# Patient Record
Sex: Female | Born: 1985 | Race: White | Hispanic: No | Smoking: Former smoker
Health system: Southern US, Community
[De-identification: ages and names within clinical notes are randomized; demographics above are authoritative.]

## PROBLEM LIST (undated history)

## (undated) ENCOUNTER — Inpatient Hospital Stay (HOSPITAL_COMMUNITY): Payer: Self-pay

## (undated) DIAGNOSIS — F32A Depression, unspecified: Secondary | ICD-10-CM

## (undated) DIAGNOSIS — N200 Calculus of kidney: Secondary | ICD-10-CM

## (undated) DIAGNOSIS — R569 Unspecified convulsions: Secondary | ICD-10-CM

## (undated) DIAGNOSIS — F329 Major depressive disorder, single episode, unspecified: Secondary | ICD-10-CM

## (undated) DIAGNOSIS — N9489 Other specified conditions associated with female genital organs and menstrual cycle: Secondary | ICD-10-CM

## (undated) DIAGNOSIS — B999 Unspecified infectious disease: Secondary | ICD-10-CM

## (undated) DIAGNOSIS — I10 Essential (primary) hypertension: Secondary | ICD-10-CM

## (undated) DIAGNOSIS — G43019 Migraine without aura, intractable, without status migrainosus: Secondary | ICD-10-CM

## (undated) DIAGNOSIS — G43909 Migraine, unspecified, not intractable, without status migrainosus: Secondary | ICD-10-CM

## (undated) DIAGNOSIS — R55 Syncope and collapse: Secondary | ICD-10-CM

## (undated) HISTORY — DX: Essential (primary) hypertension: I10

## (undated) HISTORY — DX: Migraine without aura, intractable, without status migrainosus: G43.019

## (undated) HISTORY — PX: WISDOM TOOTH EXTRACTION: SHX21

## (undated) HISTORY — DX: Syncope and collapse: R55

## (undated) HISTORY — DX: Migraine, unspecified, not intractable, without status migrainosus: G43.909

## (undated) HISTORY — PX: HAND SURGERY: SHX662

## (undated) HISTORY — DX: Calculus of kidney: N20.0

---
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
## 2005-02-04 ENCOUNTER — Emergency Department (HOSPITAL_COMMUNITY): Admission: EM | Admit: 2005-02-04 | Discharge: 2005-02-04 | Payer: Self-pay | Admitting: Emergency Medicine
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
## 2006-06-24 ENCOUNTER — Ambulatory Visit: Payer: Self-pay | Admitting: Psychiatry

## 2006-06-24 ENCOUNTER — Inpatient Hospital Stay (HOSPITAL_COMMUNITY): Admission: AD | Admit: 2006-06-24 | Discharge: 2006-06-27 | Payer: Self-pay | Admitting: Psychiatry

## 2006-06-24 ENCOUNTER — Emergency Department (HOSPITAL_COMMUNITY): Admission: EM | Admit: 2006-06-24 | Discharge: 2006-06-24 | Payer: Self-pay | Admitting: *Deleted

## 2010-03-30 ENCOUNTER — Institutional Professional Consult (permissible substitution) (INDEPENDENT_AMBULATORY_CARE_PROVIDER_SITE_OTHER): Payer: BC Managed Care – PPO | Admitting: Cardiovascular Disease

## 2010-03-30 DIAGNOSIS — R079 Chest pain, unspecified: Secondary | ICD-10-CM

## 2010-04-11 ENCOUNTER — Other Ambulatory Visit (HOSPITAL_COMMUNITY): Payer: BC Managed Care – PPO

## 2010-04-12 ENCOUNTER — Telehealth (INDEPENDENT_AMBULATORY_CARE_PROVIDER_SITE_OTHER): Payer: Self-pay | Admitting: *Deleted

## 2010-04-13 ENCOUNTER — Ambulatory Visit (HOSPITAL_COMMUNITY): Payer: BC Managed Care – PPO | Attending: Internal Medicine

## 2010-04-13 ENCOUNTER — Encounter: Payer: Self-pay | Admitting: Cardiology

## 2010-04-13 DIAGNOSIS — R011 Cardiac murmur, unspecified: Secondary | ICD-10-CM | POA: Insufficient documentation

## 2010-04-13 DIAGNOSIS — R072 Precordial pain: Secondary | ICD-10-CM | POA: Insufficient documentation

## 2010-04-13 DIAGNOSIS — I059 Rheumatic mitral valve disease, unspecified: Secondary | ICD-10-CM | POA: Insufficient documentation

## 2010-04-13 DIAGNOSIS — R0989 Other specified symptoms and signs involving the circulatory and respiratory systems: Secondary | ICD-10-CM | POA: Insufficient documentation

## 2010-04-13 DIAGNOSIS — R5381 Other malaise: Secondary | ICD-10-CM | POA: Insufficient documentation

## 2010-04-13 DIAGNOSIS — R0609 Other forms of dyspnea: Secondary | ICD-10-CM | POA: Insufficient documentation

## 2010-04-13 DIAGNOSIS — R5383 Other fatigue: Secondary | ICD-10-CM | POA: Insufficient documentation

## 2010-04-13 DIAGNOSIS — I079 Rheumatic tricuspid valve disease, unspecified: Secondary | ICD-10-CM | POA: Insufficient documentation

## 2010-04-20 NOTE — Progress Notes (Signed)
Summary: nuc pre procedure  Phone Note Outgoing Call Call back at [facility] Phone [phone]   Call placed by: Cathlyn Parsons RN,  April 12, 2010 11:27 AM Call placed to: Patient Summary of Call: Left message with information on [employer] Information Sheet (see scanned document for details).

## 2010-05-11 ENCOUNTER — Ambulatory Visit (INDEPENDENT_AMBULATORY_CARE_PROVIDER_SITE_OTHER): Payer: BC Managed Care – PPO | Admitting: Cardiovascular Disease

## 2010-05-11 DIAGNOSIS — R079 Chest pain, unspecified: Secondary | ICD-10-CM

## 2011-06-07 ENCOUNTER — Encounter: Payer: Self-pay | Admitting: *Deleted

## 2011-06-07 DIAGNOSIS — N200 Calculus of kidney: Secondary | ICD-10-CM | POA: Insufficient documentation

## 2013-02-27 NOTE — L&D Delivery Note (Signed)
Delivery Note  SVD viable female Apgars 8,9 over 2nd degree ML lac.  Placenta delivered spontaneously intact with 3VC. Repair with 2-0 Chromic with good support and hemostasis noted and R/V exam confirms.  PH art was sent.  [place] cord blood was done.  Mother and baby were doing well.  EBL 300cc  Candice Camp, MD

## 2013-04-09 LAB — OB RESULTS CONSOLE RPR: RPR: NONREACTIVE

## 2013-04-09 LAB — OB RESULTS CONSOLE HEPATITIS B SURFACE ANTIGEN: Hepatitis B Surface Ag: NEGATIVE

## 2013-04-09 LAB — OB RESULTS CONSOLE RUBELLA ANTIBODY, IGM: Rubella: IMMUNE

## 2013-04-09 LAB — OB RESULTS CONSOLE ANTIBODY SCREEN: Antibody Screen: NEGATIVE

## 2013-04-09 LAB — OB RESULTS CONSOLE ABO/RH: RH Type: POSITIVE

## 2013-04-09 LAB — OB RESULTS CONSOLE HIV ANTIBODY (ROUTINE TESTING): HIV: NONREACTIVE

## 2013-04-09 LAB — OB RESULTS CONSOLE GC/CHLAMYDIA
CHLAMYDIA, DNA PROBE: NEGATIVE
Gonorrhea: NEGATIVE

## 2013-05-14 ENCOUNTER — Encounter: Payer: Self-pay | Admitting: Cardiovascular Disease

## 2013-10-10 ENCOUNTER — Encounter (HOSPITAL_COMMUNITY): Payer: Self-pay | Admitting: *Deleted

## 2013-10-10 ENCOUNTER — Inpatient Hospital Stay (HOSPITAL_COMMUNITY)
Admission: AD | Admit: 2013-10-10 | Discharge: 2013-10-10 | Disposition: A | Discharge status: Home / Self Care | Payer: BC Managed Care – PPO | Source: Ambulatory Visit | Attending: Obstetrics and Gynecology | Admitting: Obstetrics and Gynecology

## 2013-10-10 DIAGNOSIS — Z823 Family history of stroke: Secondary | ICD-10-CM | POA: Diagnosis not present

## 2013-10-10 DIAGNOSIS — Z8249 Family history of ischemic heart disease and other diseases of the circulatory system: Secondary | ICD-10-CM | POA: Diagnosis not present

## 2013-10-10 DIAGNOSIS — O10019 Pre-existing essential hypertension complicating pregnancy, unspecified trimester: Secondary | ICD-10-CM | POA: Insufficient documentation

## 2013-10-10 DIAGNOSIS — O163 Unspecified maternal hypertension, third trimester: Secondary | ICD-10-CM

## 2013-10-10 DIAGNOSIS — R030 Elevated blood-pressure reading, without diagnosis of hypertension: Secondary | ICD-10-CM | POA: Diagnosis present

## 2013-10-10 DIAGNOSIS — Z833 Family history of diabetes mellitus: Secondary | ICD-10-CM | POA: Diagnosis not present

## 2013-10-10 DIAGNOSIS — Z87891 Personal history of nicotine dependence: Secondary | ICD-10-CM | POA: Insufficient documentation

## 2013-10-10 HISTORY — DX: Unspecified infectious disease: B99.9

## 2013-10-10 HISTORY — DX: Unspecified convulsions: R56.9

## 2013-10-10 HISTORY — DX: Major depressive disorder, single episode, unspecified: F32.9

## 2013-10-10 HISTORY — DX: Depression, unspecified: F32.A

## 2013-10-10 LAB — CBC
HCT: 34.1 % — ABNORMAL LOW (ref 36.0–46.0)
Hemoglobin: 11.4 g/dL — ABNORMAL LOW (ref 12.0–15.0)
MCH: 30.6 pg (ref 26.0–34.0)
MCHC: 33.4 g/dL (ref 30.0–36.0)
MCV: 91.4 fL (ref 78.0–100.0)
Platelets: 138 10*3/uL — ABNORMAL LOW (ref 150–400)
RBC: 3.73 MIL/uL — ABNORMAL LOW (ref 3.87–5.11)
RDW: 13 % (ref 11.5–15.5)
WBC: 8.6 10*3/uL (ref 4.0–10.5)

## 2013-10-10 LAB — URINALYSIS, ROUTINE W REFLEX MICROSCOPIC
Bilirubin Urine: NEGATIVE
Glucose, UA: NEGATIVE mg/dL
Hgb urine dipstick: NEGATIVE
Ketones, ur: NEGATIVE mg/dL
Nitrite: NEGATIVE
Protein, ur: NEGATIVE mg/dL
Specific Gravity, Urine: <1.005 — ABNORMAL LOW (ref 1.005–1.030)
Urobilinogen, UA: 0.2 mg/dL (ref 0.0–1.0)
pH: 7 (ref 5.0–8.0)

## 2013-10-10 LAB — COMPREHENSIVE METABOLIC PANEL
ALT: 13 U/L (ref 0–35)
AST: 14 U/L (ref 0–37)
Albumin: 2.8 g/dL — ABNORMAL LOW (ref 3.5–5.2)
Alkaline Phosphatase: 106 U/L (ref 39–117)
Anion gap: 11 (ref 5–15)
BUN: 14 mg/dL (ref 6–23)
CO2: 21 mEq/L (ref 19–32)
Calcium: 9.5 mg/dL (ref 8.4–10.5)
Chloride: 104 mEq/L (ref 96–112)
Creatinine, Ser: 0.78 mg/dL (ref 0.50–1.10)
GFR calc Af Amer: >90 mL/min (ref >90)
GFR calc non Af Amer: >90 mL/min (ref >90)
Glucose, Bld: 80 mg/dL (ref 70–99)
Potassium: 4 mEq/L (ref 3.7–5.3)
Sodium: 136 mEq/L — ABNORMAL LOW (ref 137–147)
Total Bilirubin: <0.2 mg/dL — ABNORMAL LOW (ref 0.3–1.2)
Total Protein: 5.9 g/dL — ABNORMAL LOW (ref 6.0–8.3)

## 2013-10-10 LAB — URINE MICROSCOPIC-ADD ON

## 2013-10-10 LAB — URIC ACID: Uric Acid, Serum: 8.3 mg/dL — ABNORMAL HIGH (ref 2.4–7.0)

## 2013-10-10 NOTE — Progress Notes (Signed)
28 yo G3P0 at 35 3/7 wks seen in office today with BP of about 146/98 and negative protein in urine. Had HA behind left eye last week. HA behind right eye last night and today-mild. No vision change and no epigastric pain.  BPs here 140s / 80s and some 90s. Lungs CTA Cor RRR Abd gravid without epigastric tenderness DTR 2+ without clonus  Results for orders placed during the hospital encounter of 10/10/13 (from the past 24 hour(s))  URINALYSIS, ROUTINE W REFLEX MICROSCOPIC     Status: Abnormal   Collection Time    10/10/13 11:10 AM      Result Value Ref Range   Color, Urine YELLOW  YELLOW   APPearance CLEAR  CLEAR   Specific Gravity, Urine <1.005 (*) 1.005 - 1.030   pH 7.0  5.0 - 8.0   Glucose, UA NEGATIVE  NEGATIVE mg/dL   Hgb urine dipstick NEGATIVE  NEGATIVE   Bilirubin Urine NEGATIVE  NEGATIVE   Ketones, ur NEGATIVE  NEGATIVE mg/dL   Protein, ur NEGATIVE  NEGATIVE mg/dL   Urobilinogen, UA 0.2  0.0 - 1.0 mg/dL   Nitrite NEGATIVE  NEGATIVE   Leukocytes, UA TRACE (*) NEGATIVE  URINE MICROSCOPIC-ADD ON     Status: Abnormal   Collection Time    10/10/13 11:10 AM      Result Value Ref Range   Squamous Epithelial / LPF RARE  RARE   WBC, UA 3-6  <3 WBC/hpf   Bacteria, UA FEW (*) RARE  CBC     Status: Abnormal   Collection Time    10/10/13 11:44 AM      Result Value Ref Range   WBC 8.6  4.0 - 10.5 K/uL   RBC 3.73 (*) 3.87 - 5.11 MIL/uL   Hemoglobin 11.4 (*) 12.0 - 15.0 g/dL   HCT 16.1 (*) 09.6 - 04.5 %   MCV 91.4  78.0 - 100.0 fL   MCH 30.6  26.0 - 34.0 pg   MCHC 33.4  30.0 - 36.0 g/dL   RDW 40.9  81.1 - 91.4 %   Platelets 138 (*) 150 - 400 K/uL  COMPREHENSIVE METABOLIC PANEL     Status: Abnormal   Collection Time    10/10/13 11:44 AM      Result Value Ref Range   Sodium 136 (*) 137 - 147 mEq/L   Potassium 4.0  3.7 - 5.3 mEq/L   Chloride 104  96 - 112 mEq/L   CO2 21  19 - 32 mEq/L   Glucose, Bld 80  70 - 99 mg/dL   BUN 14  6 - 23 mg/dL   Creatinine, Ser 7.82  0.50 -  1.10 mg/dL   Calcium 9.5  8.4 - 95.6 mg/dL   Total Protein 5.9 (*) 6.0 - 8.3 g/dL   Albumin 2.8 (*) 3.5 - 5.2 g/dL   AST 14  0 - 37 U/L   ALT 13  0 - 35 U/L   Alkaline Phosphatase 106  39 - 117 U/L   Total Bilirubin <0.2 (*) 0.3 - 1.2 mg/dL   GFR calc non Af Amer >90  >90 mL/min   GFR calc Af Amer >90  >90 mL/min   Anion gap 11  5 - 15  URIC ACID     Status: Abnormal   Collection Time    10/10/13 11:44 AM      Result Value Ref Range   Uric Acid, Serum 8.3 (*) 2.4 - 7.0 mg/dL   A: Hypertension  in pregnancy      No evidence of preeclampsia  P: D/C home     Careful instructions given for MBR and reviewed symptoms of preeclampsia     FU office Monday in 3 days

## 2013-10-10 NOTE — Discharge Instructions (Signed)

## 2013-10-10 NOTE — MAU Note (Signed)
Sent from office,  Recent problem with elevated BP.  Has a throbbing headache,for further eval

## 2013-10-10 NOTE — MAU Provider Note (Signed)
History     CSN: [id number]  Arrival date and time: 10/10/13 1054   First Provider Initiated Contact with Patient 10/10/13 1257      Chief Complaint  Patient presents with   Hypertension   Headache   Hypertension Associated symptoms include headaches and shortness of breath. Pertinent negatives include no blurred vision, chest pain or palpitations.  Headache  Associated symptoms include back pain, dizziness, nausea and vomiting. Pertinent negatives include no blurred vision, coughing, fever, photophobia, sore throat or weakness. Her past medical history is significant for hypertension.   Sabrina Shamsicole Stradford is a 28 y.o. G3P0020 presenting to MAU with complaint of elevated blood pressure and headache.  Blood pressure was first noted to be elevated last week.  She has had headaches throughout the pregnancy.  Headache is moderate at this time.  She was seen in the office earlier today and advised to report to MAU.  She denies LOF, bleeding, contractions, dysuria.  She does not want any medication for headache at this time.   OB History   Grav Para Term Preterm Abortions TAB SAB Ect Mult Living   3    2  2          Past Medical History  Diagnosis Date   Kidney stones    Seizures     related to heat or not eating   Infection     UTI   Depression     not on meds, doing well    Past Surgical History  Procedure Laterality Date   Hand surgery      left; cyst removed    Family History  Problem Relation Age of Onset   Heart disease Maternal Uncle    Stroke Maternal Grandmother     great grandma   Hypertension Maternal Grandmother    Heart disease Maternal Grandfather    Diabetes Paternal Grandmother    Hearing loss Neg Hx     History  Substance Use Topics   Smoking status: Former Smoker   Smokeless tobacco: Never Used     Comment: quit 2013   Alcohol Use: Yes     Comment: none with preg    Allergies:  Allergies  Allergen Reactions   Other Rash     Nitrile exam gloves    Prescriptions prior to admission  Medication Sig Dispense Refill   acetaminophen (TYLENOL) 500 MG tablet Take 1,000 mg by mouth every 6 (six) hours as needed for headache.       Prenatal Vit-Fe Fumarate-FA (PRENATAL MULTIVITAMIN) TABS tablet Take 1 tablet by mouth daily at 12 noon.        Review of Systems  Constitutional: Negative for fever and chills.  HENT: Positive for congestion. Negative for sore throat.   Eyes: Negative for blurred vision and photophobia.  Respiratory: Positive for shortness of breath and wheezing. Negative for cough.   Cardiovascular: Negative for chest pain and palpitations.  Gastrointestinal: Positive for heartburn, nausea and vomiting. Negative for diarrhea and constipation.  Genitourinary: Positive for frequency. Negative for dysuria and urgency.  Musculoskeletal: Positive for back pain.  Skin: Positive for itching.  Neurological: Positive for dizziness and headaches. Negative for weakness.   Physical Exam   Blood pressure 148/99, pulse 65.  Physical Exam  Constitutional: She is oriented to person, place, and time. She appears well-developed and well-nourished. No distress.  HENT:  Head: Normocephalic and atraumatic.  Neck: Normal range of motion.  Cardiovascular: Normal rate and regular rhythm.  Exam reveals no gallop and  no friction rub.   No murmur heard. Respiratory: Breath sounds normal. She is in respiratory distress.  GI: Soft. She exhibits no distension. There is no tenderness.  Musculoskeletal: Normal range of motion.  Neurological: She is alert and oriented to person, place, and time.  Skin: Skin is warm and dry.  Psychiatric: She has a normal mood and affect.   Results for orders placed during the hospital encounter of 10/10/13 (from the past 24 hour(s))  URINALYSIS, ROUTINE W REFLEX MICROSCOPIC     Status: Abnormal   Collection Time    10/10/13 11:10 AM      Result Value Ref Range   Color, Urine YELLOW   YELLOW   APPearance CLEAR  CLEAR   Specific Gravity, Urine <1.005 (*) 1.005 - 1.030   pH 7.0  5.0 - 8.0   Glucose, UA NEGATIVE  NEGATIVE mg/dL   Hgb urine dipstick NEGATIVE  NEGATIVE   Bilirubin Urine NEGATIVE  NEGATIVE   Ketones, ur NEGATIVE  NEGATIVE mg/dL   Protein, ur NEGATIVE  NEGATIVE mg/dL   Urobilinogen, UA 0.2  0.0 - 1.0 mg/dL   Nitrite NEGATIVE  NEGATIVE   Leukocytes, UA TRACE (*) NEGATIVE  URINE MICROSCOPIC-ADD ON     Status: Abnormal   Collection Time    10/10/13 11:10 AM      Result Value Ref Range   Squamous Epithelial / LPF RARE  RARE   WBC, UA 3-6  <3 WBC/hpf   Bacteria, UA FEW (*) RARE  CBC     Status: Abnormal   Collection Time    10/10/13 11:44 AM      Result Value Ref Range   WBC 8.6  4.0 - 10.5 K/uL   RBC 3.73 (*) 3.87 - 5.11 MIL/uL   Hemoglobin 11.4 (*) 12.0 - 15.0 g/dL   HCT 16.1 (*) 09.6 - 04.5 %   MCV 91.4  78.0 - 100.0 fL   MCH 30.6  26.0 - 34.0 pg   MCHC 33.4  30.0 - 36.0 g/dL   RDW 40.9  81.1 - 91.4 %   Platelets 138 (*) 150 - 400 K/uL  COMPREHENSIVE METABOLIC PANEL     Status: Abnormal   Collection Time    10/10/13 11:44 AM      Result Value Ref Range   Sodium 136 (*) 137 - 147 mEq/L   Potassium 4.0  3.7 - 5.3 mEq/L   Chloride 104  96 - 112 mEq/L   CO2 21  19 - 32 mEq/L   Glucose, Bld 80  70 - 99 mg/dL   BUN 14  6 - 23 mg/dL   Creatinine, Ser 7.82  0.50 - 1.10 mg/dL   Calcium 9.5  8.4 - 95.6 mg/dL   Total Protein 5.9 (*) 6.0 - 8.3 g/dL   Albumin 2.8 (*) 3.5 - 5.2 g/dL   AST 14  0 - 37 U/L   ALT 13  0 - 35 U/L   Alkaline Phosphatase 106  39 - 117 U/L   Total Bilirubin <0.2 (*) 0.3 - 1.2 mg/dL   GFR calc non Af Amer >90  >90 mL/min   GFR calc Af Amer >90  >90 mL/min   Anion gap 11  5 - 15  URIC ACID     Status: Abnormal   Collection Time    10/10/13 11:44 AM      Result Value Ref Range   Uric Acid, Serum 8.3 (*) 2.4 - 7.0 mg/dL     MAU Course  Procedures Fetal Tracing:  Baseline:130  Variability:moderate Accelerations:15x15   Decelerations:none  Toco:irritable   MDM Discussed with Dr. Henderson Cloud who came to see pt.  He has ordered discharge for pt.  Assessment and Plan  A: 35 weeks, Hypertension  Plan: Discharge to home.  Follow up with [facility] as scheduled Return to MAU for emergencies  Bertram Denver 10/10/2013, 12:57 PM

## 2013-10-13 ENCOUNTER — Observation Stay (HOSPITAL_COMMUNITY)
Admission: AD | Admit: 2013-10-13 | Discharge: 2013-10-14 | Disposition: A | Discharge status: Home / Self Care | Payer: BC Managed Care – PPO | Source: Ambulatory Visit | Attending: Obstetrics and Gynecology | Admitting: Obstetrics and Gynecology

## 2013-10-13 ENCOUNTER — Encounter (HOSPITAL_COMMUNITY): Payer: Self-pay | Admitting: *Deleted

## 2013-10-13 DIAGNOSIS — O99340 Other mental disorders complicating pregnancy, unspecified trimester: Secondary | ICD-10-CM | POA: Insufficient documentation

## 2013-10-13 DIAGNOSIS — Z8619 Personal history of other infectious and parasitic diseases: Secondary | ICD-10-CM | POA: Diagnosis not present

## 2013-10-13 DIAGNOSIS — Z87442 Personal history of urinary calculi: Secondary | ICD-10-CM | POA: Insufficient documentation

## 2013-10-13 DIAGNOSIS — F3289 Other specified depressive episodes: Secondary | ICD-10-CM | POA: Diagnosis not present

## 2013-10-13 DIAGNOSIS — F329 Major depressive disorder, single episode, unspecified: Secondary | ICD-10-CM | POA: Diagnosis not present

## 2013-10-13 DIAGNOSIS — R609 Edema, unspecified: Secondary | ICD-10-CM | POA: Diagnosis not present

## 2013-10-13 DIAGNOSIS — O139 Gestational [pregnancy-induced] hypertension without significant proteinuria, unspecified trimester: Principal | ICD-10-CM | POA: Diagnosis present

## 2013-10-13 DIAGNOSIS — O9989 Other specified diseases and conditions complicating pregnancy, childbirth and the puerperium: Secondary | ICD-10-CM | POA: Diagnosis not present

## 2013-10-13 DIAGNOSIS — Z87891 Personal history of nicotine dependence: Secondary | ICD-10-CM | POA: Insufficient documentation

## 2013-10-13 DIAGNOSIS — G40909 Epilepsy, unspecified, not intractable, without status epilepticus: Secondary | ICD-10-CM | POA: Insufficient documentation

## 2013-10-13 DIAGNOSIS — O133 Gestational [pregnancy-induced] hypertension without significant proteinuria, third trimester: Secondary | ICD-10-CM

## 2013-10-13 LAB — URINALYSIS, ROUTINE W REFLEX MICROSCOPIC
Bilirubin Urine: NEGATIVE
Glucose, UA: NEGATIVE mg/dL
Hgb urine dipstick: NEGATIVE
Ketones, ur: NEGATIVE mg/dL
Leukocytes, UA: NEGATIVE
Nitrite: NEGATIVE
Protein, ur: NEGATIVE mg/dL
Specific Gravity, Urine: <1.005 — ABNORMAL LOW (ref 1.005–1.030)
Urobilinogen, UA: 0.2 mg/dL (ref 0.0–1.0)
pH: 6.5 (ref 5.0–8.0)

## 2013-10-13 LAB — COMPREHENSIVE METABOLIC PANEL
ALT: 12 U/L (ref 0–35)
AST: 14 U/L (ref 0–37)
Albumin: 3 g/dL — ABNORMAL LOW (ref 3.5–5.2)
Alkaline Phosphatase: 116 U/L (ref 39–117)
Anion gap: 15 (ref 5–15)
BUN: 15 mg/dL (ref 6–23)
CO2: 21 mEq/L (ref 19–32)
Calcium: 9.9 mg/dL (ref 8.4–10.5)
Chloride: 100 mEq/L (ref 96–112)
Creatinine, Ser: 0.81 mg/dL (ref 0.50–1.10)
GFR calc Af Amer: >90 mL/min (ref >90)
GFR calc non Af Amer: >90 mL/min (ref >90)
Glucose, Bld: 74 mg/dL (ref 70–99)
Potassium: 4.2 mEq/L (ref 3.7–5.3)
Sodium: 136 mEq/L — ABNORMAL LOW (ref 137–147)
Total Bilirubin: <0.2 mg/dL — ABNORMAL LOW (ref 0.3–1.2)
Total Protein: 6.3 g/dL (ref 6.0–8.3)

## 2013-10-13 LAB — URIC ACID: Uric Acid, Serum: 8.6 mg/dL — ABNORMAL HIGH (ref 2.4–7.0)

## 2013-10-13 LAB — CBC
HCT: 35.4 % — ABNORMAL LOW (ref 36.0–46.0)
Hemoglobin: 12.2 g/dL (ref 12.0–15.0)
MCH: 31.5 pg (ref 26.0–34.0)
MCHC: 34.5 g/dL (ref 30.0–36.0)
MCV: 91.5 fL (ref 78.0–100.0)
Platelets: 147 10*3/uL — ABNORMAL LOW (ref 150–400)
RBC: 3.87 MIL/uL (ref 3.87–5.11)
RDW: 13.2 % (ref 11.5–15.5)
WBC: 11.4 10*3/uL — ABNORMAL HIGH (ref 4.0–10.5)

## 2013-10-13 LAB — OB RESULTS CONSOLE GBS: GBS: NEGATIVE

## 2013-10-13 MED ORDER — PRENATAL MULTIVITAMIN CH: 1.0000 | ORAL_TABLET | Freq: Every day | ORAL | Status: DC

## 2013-10-13 MED ORDER — CALCIUM CARBONATE ANTACID 500 MG PO CHEW: 2.0000 | CHEWABLE_TABLET | ORAL | Status: DC | PRN

## 2013-10-13 MED ORDER — ACETAMINOPHEN 325 MG PO TABS: 650.0000 mg | ORAL_TABLET | ORAL | Status: DC | PRN

## 2013-10-13 MED ORDER — ZOLPIDEM TARTRATE 5 MG PO TABS: 5.0000 mg | ORAL_TABLET | Freq: Every evening | ORAL | Status: DC | PRN

## 2013-10-13 MED ORDER — DOCUSATE SODIUM 100 MG PO CAPS: 100.0000 mg | ORAL_CAPSULE | Freq: Every day | ORAL | Status: DC

## 2013-10-13 NOTE — MAU Provider Note (Signed)
History     CSN: [id number]  Arrival date and time: 10/13/13 1709   First Provider Initiated Contact with Patient 10/13/13 1924      No chief complaint on file.  HPI  Ms. Sabrina Carney is a 28 y.o. female G3P0020 at 21w6dwho presents to MAU for further evaluation of evelated BP readings in the office. She was seen here on 8/14 for similar complaints. She is due to be induced next Tuesday. She denies HA, blurred vision or abdominal pain. Reports good fetal movement.    OB History   Grav Para Term Preterm Abortions TAB SAB Ect Mult Living   _0 0      Past Medical History  Diagnosis Date  . Kidney stones   . Infection     UTI  . Depression     not on meds, doing well  . Seizures     related to heat or not eating    Past Surgical History  Procedure Laterality Date  . Hand surgery      left; cyst removed  . Wisdom tooth extraction      Family History  Problem Relation Age of Onset  . Heart disease Maternal Uncle   . Stroke Maternal Grandmother     great grandma  . Hypertension Maternal Grandmother   . Heart disease Maternal Grandfather   . Diabetes Paternal Grandmother   . Hearing loss Neg Hx     History  Substance Use Topics  . Smoking status: Former Smoker    Quit date: 10/14/2011  . Smokeless tobacco: Never Used     Comment: quit 2013  . Alcohol Use: Yes     Comment: none with preg    Allergies:  Allergies  Allergen Reactions  . Hydrocodone Nausea And Vomiting  . Other Rash    Nitrile exam gloves    Prescriptions prior to admission  Medication Sig Dispense Refill  . acetaminophen (TYLENOL) 500 MG tablet Take 1,000 mg by mouth every 6 (six) hours as needed for headache.      . Prenatal Vit-Fe Fumarate-FA (PRENATAL MULTIVITAMIN) TABS tablet Take 1 tablet by mouth daily at 12 noon.       Results for orders placed during the hospital encounter of 10/13/13 (from the past 48 hour(s))  URINALYSIS, ROUTINE W REFLEX MICROSCOPIC     Status:  Abnormal   Collection Time    10/13/13  6:15 PM      Result Value Ref Range   Color, Urine STRAW (*) YELLOW   APPearance CLEAR  CLEAR   Specific Gravity, Urine <1.005 (*) 1.005 - 1.030   pH 6.5  5.0 - 8.0   Glucose, UA NEGATIVE  NEGATIVE mg/dL   Hgb urine dipstick NEGATIVE  NEGATIVE   Bilirubin Urine NEGATIVE  NEGATIVE   Ketones, ur NEGATIVE  NEGATIVE mg/dL   Protein, ur NEGATIVE  NEGATIVE mg/dL   Urobilinogen, UA 0.2  0.0 - 1.0 mg/dL   Nitrite NEGATIVE  NEGATIVE   Leukocytes, UA NEGATIVE  NEGATIVE   Comment: MICROSCOPIC NOT DONE ON URINES WITH NEGATIVE PROTEIN, BLOOD, LEUKOCYTES, NITRITE, OR GLUCOSE <1000 mg/dL.  CBC     Status: Abnormal   Collection Time    10/13/13  6:20 PM      Result Value Ref Range   WBC 11.4 (*) 4.0 - 10.5 K/uL   RBC 3.87  3.87 - 5.11 MIL/uL   Hemoglobin 12.2  12.0 - 15.0 g/dL  HCT 35.4 (*) 36.0 - 46.0 %   MCV 91.5  78.0 - 100.0 fL   MCH 31.5  26.0 - 34.0 pg   MCHC 34.5  30.0 - 36.0 g/dL   RDW 13.2  11.5 - 15.5 %   Platelets 147 (*) 150 - 400 K/uL  COMPREHENSIVE METABOLIC PANEL     Status: Abnormal   Collection Time    10/13/13  6:20 PM      Result Value Ref Range   Sodium 136 (*) 137 - 147 mEq/L   Potassium 4.2  3.7 - 5.3 mEq/L   Chloride 100  96 - 112 mEq/L   CO2 21  19 - 32 mEq/L   Glucose, Bld 74  70 - 99 mg/dL   BUN 15  6 - 23 mg/dL   Creatinine, Ser 0.81  0.50 - 1.10 mg/dL   Calcium 9.9  8.4 - 10.5 mg/dL   Total Protein 6.3  6.0 - 8.3 g/dL   Albumin 3.0 (*) 3.5 - 5.2 g/dL   AST 14  0 - 37 U/L   ALT 12  0 - 35 U/L   Alkaline Phosphatase 116  39 - 117 U/L   Total Bilirubin <0.2 (*) 0.3 - 1.2 mg/dL   GFR calc non Af Amer >90  >90 mL/min   GFR calc Af Amer >90  >90 mL/min   Comment: (NOTE)     The eGFR has been calculated using the CKD EPI equation.     This calculation has not been validated in all clinical situations.     eGFR's persistently <90 mL/min signify possible Chronic Kidney     Disease.   Anion gap 15  5 - 15  URIC ACID      Status: Abnormal   Collection Time    10/13/13  6:20 PM      Result Value Ref Range   Uric Acid, Serum 8.6 (*) 2.4 - 7.0 mg/dL    Review of Systems  Eyes: Negative for blurred vision.  Gastrointestinal: Positive for nausea. Negative for vomiting and abdominal pain.  Neurological: Negative for dizziness and headaches.   Physical Exam   Blood pressure 130/84, pulse 63, temperature 97.6 F (36.4 C), temperature source Oral, resp. rate 16, height _0  (1.778 m), weight 103.874 kg (229 lb).  Physical Exam  Constitutional: She is oriented to person, place, and time. She appears well-developed and well-nourished. No distress.  HENT:  Head: Normocephalic.  Eyes: Pupils are equal, round, and reactive to light.  Neck: Neck supple.  Respiratory: Effort normal.  GI: Soft. Normal appearance. She exhibits no distension. There is no tenderness. There is no CVA tenderness.  Musculoskeletal:       Right ankle: She exhibits swelling.       Left ankle: She exhibits swelling.  Neurological: She is alert and oriented to person, place, and time. She has normal reflexes. GCS eye subscore is 4. GCS verbal subscore is 5. GCS motor subscore is 6.  Skin: Skin is warm. She is not diaphoretic.   Fetal Tracing: Baseline: 125 bpm  Variability: 15x15 Accelerations: 15x15 Decelerations: None Toco: None   MAU Course  Procedures None  MDM CBC Uric Acid CMP UA Fetal monitoring  Discussed patient with Dr. Helane Rima; plan to admit patient to [facility] for observation   Assessment and Plan   A:  1. Pregnancy induced hypertension, third trimester    P:  Admit to [facility] for observation per Dr. Helane Rima  Repeat labs in the [facility]  Darrelyn Hillock Shaneequa Bahner, NP  10/13/2013, 7:24 PM

## 2013-10-14 LAB — COMPREHENSIVE METABOLIC PANEL
ALBUMIN: 2.6 g/dL — AB (ref 3.5–5.2)
ALK PHOS: 113 U/L (ref 39–117)
ALT: 11 U/L (ref 0–35)
AST: 13 U/L (ref 0–37)
Anion gap: 12 (ref 5–15)
BUN: 15 mg/dL (ref 6–23)
CO2: 22 mEq/L (ref 19–32)
Calcium: 8.8 mg/dL (ref 8.4–10.5)
Chloride: 105 mEq/L (ref 96–112)
Creatinine, Ser: 0.8 mg/dL (ref 0.50–1.10)
GFR calc Af Amer: >90 mL/min (ref >90)
GFR calc non Af Amer: >90 mL/min (ref >90)
Glucose, Bld: 77 mg/dL (ref 70–99)
Potassium: 4.3 mEq/L (ref 3.7–5.3)
SODIUM: 139 meq/L (ref 137–147)
Total Bilirubin: <0.2 mg/dL — ABNORMAL LOW (ref 0.3–1.2)
Total Protein: 5.8 g/dL — ABNORMAL LOW (ref 6.0–8.3)

## 2013-10-14 LAB — URINALYSIS, ROUTINE W REFLEX MICROSCOPIC
BILIRUBIN URINE: NEGATIVE
GLUCOSE, UA: NEGATIVE mg/dL
Hgb urine dipstick: NEGATIVE
Ketones, ur: NEGATIVE mg/dL
LEUKOCYTES UA: NEGATIVE
Nitrite: NEGATIVE
PH: 7 (ref 5.0–8.0)
Protein, ur: NEGATIVE mg/dL
Specific Gravity, Urine: <1.005 — ABNORMAL LOW (ref 1.005–1.030)
Urobilinogen, UA: 0.2 mg/dL (ref 0.0–1.0)

## 2013-10-14 LAB — CBC
HCT: 35.6 % — ABNORMAL LOW (ref 36.0–46.0)
HEMOGLOBIN: 12 g/dL (ref 12.0–15.0)
MCH: 30.9 pg (ref 26.0–34.0)
MCHC: 33.7 g/dL (ref 30.0–36.0)
MCV: 91.8 fL (ref 78.0–100.0)
Platelets: 136 10*3/uL — ABNORMAL LOW (ref 150–400)
RBC: 3.88 MIL/uL (ref 3.87–5.11)
RDW: 13.1 % (ref 11.5–15.5)
WBC: 8.1 10*3/uL (ref 4.0–10.5)

## 2013-10-14 LAB — URIC ACID: Uric Acid, Serum: 8.5 mg/dL — ABNORMAL HIGH (ref 2.4–7.0)

## 2013-10-14 NOTE — Discharge Instructions (Signed)
°Hypertension During Pregnancy °Hypertension is also called high blood pressure. Blood pressure moves blood in your body. Sometimes, the force that moves the blood becomes too strong. When you are pregnant, this condition should be watched carefully. It can cause problems for you and your baby. °HOME CARE  °· Make and keep all of your doctor visits. °· Take medicine as told by your doctor. Tell your doctor about all medicines you take. °· Eat very little salt. °· Exercise regularly. °· Do not drink alcohol. °· Do not smoke. °· Do not have drinks with caffeine. °· Lie on your left side when resting. °· Your health care provider may ask you to take one low-dose aspirin (81mg) each day. °GET HELP RIGHT AWAY IF: °· You have bad belly (abdominal) pain. °· You have sudden puffiness (swelling) in the hands, ankles, or face. °· You gain 4 pounds (1.8 kilograms) or more in 1 week. °· You throw up (vomit) repeatedly. °· You have bleeding from the vagina. °· You do not feel the baby moving as much. °· You have a headache. °· You have blurred or double vision. °· You have muscle twitching or spasms. °· You have shortness of breath. °· You have blue fingernails and lips. °· You have blood in your pee (urine). °MAKE SURE YOU: °· Understand these instructions. °· Will watch your condition. °· Will get help right away if you are not doing well or get worse. °Document Released: 03/18/2010 Document Revised: 06/30/2013 Document Reviewed: 09/12/2012 °ExitCare® Patient Information ©2015 [employer]. This information is not intended to replace advice given to you by your health care provider. Make sure you discuss any questions you have with your health care provider. ° ° °Preeclampsia and Eclampsia °Preeclampsia is a serious condition that develops only during pregnancy. It is also called toxemia of pregnancy. This condition causes high blood pressure along with other symptoms, such as swelling and headaches. These may develop as the  condition gets worse. Preeclampsia may occur 20 weeks or later into your pregnancy.  °Diagnosing and treating preeclampsia early is very important. If not treated early, it can cause serious problems for you and your baby. One problem it can lead to is eclampsia, which is a condition that causes muscle jerking or shaking (convulsions) in the mother. Delivering your baby is the best treatment for preeclampsia or eclampsia.  °RISK FACTORS °The cause of preeclampsia is not known. You may be more likely to develop preeclampsia if you have certain risk factors. These include:  °· Being pregnant for the first time. °· Having preeclampsia in a past pregnancy. °· Having a family history of preeclampsia. °· Having high blood pressure. °· Being pregnant with twins or triplets. °· Being 35 or older. °· Being African American. °· Having kidney disease or diabetes. °· Having medical conditions such as lupus or blood diseases. °· Being very overweight (obese). °SIGNS AND SYMPTOMS  °The earliest signs of preeclampsia are: °· High blood pressure. °· Increased protein in your urine. Your health care provider will check for this at every prenatal visit. °Other symptoms that can develop include:  °· Severe headaches. °· Sudden weight gain. °· Swelling of your hands, face, legs, and feet. °· Feeling sick to your stomach (nauseous) and throwing up (vomiting). °· Vision problems (blurred or double vision). °· Numbness in your face, arms, legs, and feet. °· Dizziness. °· Slurred speech. °· Sensitivity to bright lights. °· Abdominal pain. °DIAGNOSIS  °There are no screening tests for preeclampsia. Your health   provider will ask you about symptoms and check for signs of preeclampsia during your prenatal visits. You may also have tests, including:  Urine testing.  Blood testing.  Checking your baby's heart rate.  Checking the health of your baby and your placenta using images created with sound waves (ultrasound). TREATMENT    You can work out the best treatment approach together with your health care provider. It is very important to keep all prenatal appointments. If you have an increased risk of preeclampsia, you may need more frequent prenatal exams.  Your health care provider may prescribe bed rest.  You may have to eat as little salt as possible.  You may need to take medicine to lower your blood pressure if the condition does not respond to more conservative measures.  You may need to stay in the hospital if your condition is severe. There, treatment will focus on controlling your blood pressure and fluid retention. You may also need to take medicine to prevent seizures.  If the condition gets worse, your baby may need to be delivered early to protect you and the baby. You may have your labor started with medicine (be induced), or you may have a cesarean delivery.  Preeclampsia usually goes away after the baby is born. HOME CARE INSTRUCTIONS   Only take over-the-counter or prescription medicines as directed by your health care provider.  Lie on your left side while resting. This keeps pressure off your baby.  Elevate your feet while resting.  Get regular exercise. Ask your health care provider what type of exercise is safe for you.  Avoid caffeine and alcohol.  Do not smoke.  Drink 6-8 glasses of water every day.  Eat a balanced diet that is low in salt. Do not add salt to your food.  Avoid stressful situations as much as possible.  Get plenty of rest and sleep.  Keep all prenatal appointments and tests as scheduled. SEEK MEDICAL CARE IF:  You are gaining more weight than expected.  You have any headaches, abdominal pain, or nausea.  You are bruising more than usual.  You feel dizzy or light-headed. SEEK IMMEDIATE MEDICAL CARE IF:   You develop sudden or severe swelling anywhere in your body. This usually happens in the legs.  You gain 5 lb (2.3 kg) or more in a week.  You have a  severe headache, dizziness, problems with your vision, or confusion.  You have severe abdominal pain.  You have lasting nausea or vomiting.  You have a seizure.  You have trouble moving any part of your body.  You develop numbness in your body.  You have trouble speaking.  You have any abnormal bleeding.  You develop a stiff neck.  You pass out. MAKE SURE YOU:   Understand these instructions.  Will watch your condition.  Will get help right away if you are not doing well or get worse. Document Released: 02/11/2000 Document Revised: 02/18/2013 Document Reviewed: 12/06/2012 [facility] Patient Information 2015 [place], [place]. This information is not intended to replace advice given to you by your health care provider. Make sure you discuss any questions you have with your health care provider.  Early Elective Birth Early elective birth refers to making a choice to have a baby before the time the baby is due. The length of a pregnancy is 9 months, or 40 weeks, starting from the beginning of a woman's last menstrual period. Most women naturally go into labor around 40 weeks of gestation. A full-term pregnancy  is considered between 37 weeks and 42 weeks of gestation. Currently, early elective births can take place sometime after 39 weeks of gestation. Most health care providers practice within the guidelines of delivering a baby no later than 42 weeks of gestation and no earlier than 39 weeks of gestation. There are exceptions to this time interval, and the risks involved to the mother and baby need to be considered in those cases.  Induction of labor refers to the use of medicines to bring aboutcontractions. Labor is when the cervix starts to widen (dilate). Active labor is when there are contractions and the cervix has dilated to at least 4 cm. Oftentimes, the earlier a mother is in her pregnancy, the longer it takes to get induced. When the cervix is ready (dilated and soft), an induction  may take less than a day. However, when a cervix is far away from being ready (long, closed, and firm), it may take days in a hospital for labor to start.  Currently, 39 weeks of gestation is considered the earliest a health care providershould start the induction process. This is because the longer the baby stays inside the uterus, the lower the risks are to both the baby and mother. However, sometimes there are very good reasons for a pregnancy to be induced before 39 weeks of gestation. These exceptions are specific to each individual pregnancy and need to be considered on a case-by-case basis. A good reason to induce one pregnancy may not be a good reason for another pregnancy.  REASONS FOR ELECTIVE BIRTH It may be safer to induce labor before 39 weeks of gestation if:   A woman is carrying more than 1 baby. Current standards are to deliver twin pregnancies at 38 weeks of gestation.  A woman is having complications, such as:  High blood pressure caused by pregnancy (preeclampsia).  Bleeding.  Infection.  There are conditions affecting the baby's health, such as:  Intrauterine growth restriction (IUGR), where the baby is not growing well.  Having abnormal fetal heart rate patterns on the monitor (nonreassuring tracing).  Having a lack of fluid that surrounds the baby (oligohydramnios).  Having placental issues.  Fluid that surrounds the baby (amniotic fluid) is leaking. There are many other safety reasons that a pregnancy may need to be induced early. REASONS AGAINST ELECTIVE BIRTH Sometimes early elective birth is not the best choice. It may not be a good idea if:   An early birth is just more convenient.  You want the baby to be born on a certain date, like a holiday.  You are more likely to need a cesarean delivery before 39 weeks of gestation. A cesarean delivery can lead to other problems. Problems include infection, bleeding, and not having enough iron in your blood  (anemia), which can cause weakness.  Babies born early (34-37 weeks of gestation):  May need special care at the hospital or in a special care nursery.  Are at a greater risk for:  Brain damage.  Feeding problems.  Breathing problems.  Slow physical and mental development.  May need special care in a neonatal intensive care unit (NICU), but this is rare. The length of the baby's stay in the hospital will depend on how quickly he or she progresses to a safe level of care.  Are at a greater risk for:  Infection.  Bleeding inside the brain.  Dying during their first year of life. REDUCING EARLY ELECTIVE BIRTHS Carrying a baby longer than 42 weeks of gestation  is not good for the baby or the mother. A full-term pregnancy is best for baby and mother. Anything earlier can be risky for you and your baby. Remember:  An early elective birth may lead to a cesarean delivery. This can lead to other problems for the mother and baby.  An early elective birth can result in developmental problems for your child.  A baby's brain continues to develop while in the uterus.  A baby's body continues to develop. The baby will be better able to breathe and eat when he or she is born near the due date.  A baby who stays in the uterus longer responds better. The baby will also bond better with you. Document Released: 10/26/2010 Document Revised: 06/30/2013 Document Reviewed: 09/12/2012 [facility] Patient Information 2015 [place], [place]. This information is not intended to replace advice given to you by your health care provider. Make sure you discuss any questions you have with your health care provider.  Early Elective Birth Early elective birth refers to making a choice to have a baby before the time the baby is due. The length of a pregnancy is 9 months, or 40 weeks, starting from the beginning of a woman's last menstrual period. Most women naturally go into labor around 40 weeks of gestation. A  full-term pregnancy is considered between 37 weeks and 42 weeks of gestation. Currently, early elective births can take place sometime after 39 weeks of gestation. Most health care providers practice within the guidelines of delivering a baby no later than 42 weeks of gestation and no earlier than 39 weeks of gestation. There are exceptions to this time interval, and the risks involved to the mother and baby need to be considered in those cases.  Induction of labor refers to the use of medicines to bring aboutcontractions. Labor is when the cervix starts to widen (dilate). Active labor is when there are contractions and the cervix has dilated to at least 4 cm. Oftentimes, the earlier a mother is in her pregnancy, the longer it takes to get induced. When the cervix is ready (dilated and soft), an induction may take less than a day. However, when a cervix is far away from being ready (long, closed, and firm), it may take days in a hospital for labor to start.  Currently, 39 weeks of gestation is considered the earliest a health care providershould start the induction process. This is because the longer the baby stays inside the uterus, the lower the risks are to both the baby and mother. However, sometimes there are very good reasons for a pregnancy to be induced before 39 weeks of gestation. These exceptions are specific to each individual pregnancy and need to be considered on a case-by-case basis. A good reason to induce one pregnancy may not be a good reason for another pregnancy.  REASONS FOR ELECTIVE BIRTH It may be safer to induce labor before 39 weeks of gestation if:   A woman is carrying more than 1 baby. Current standards are to deliver twin pregnancies at 38 weeks of gestation.  A woman is having complications, such as:  High blood pressure caused by pregnancy (preeclampsia).  Bleeding.  Infection.  There are conditions affecting the baby's health, such as:  Intrauterine growth  restriction (IUGR), where the baby is not growing well.  Having abnormal fetal heart rate patterns on the monitor (nonreassuring tracing).  Having a lack of fluid that surrounds the baby (oligohydramnios).  Having placental issues.  Fluid that surrounds the baby (  amniotic fluid) is leaking. There are many other safety reasons that a pregnancy may need to be induced early. REASONS AGAINST ELECTIVE BIRTH Sometimes early elective birth is not the best choice. It may not be a good idea if:   An early birth is just more convenient.  You want the baby to be born on a certain date, like a holiday.  You are more likely to need a cesarean delivery before 39 weeks of gestation. A cesarean delivery can lead to other problems. Problems include infection, bleeding, and not having enough iron in your blood (anemia), which can cause weakness.  Babies born early (34-37 weeks of gestation):  May need special care at the hospital or in a special care nursery.  Are at a greater risk for:  Brain damage.  Feeding problems.  Breathing problems.  Slow physical and mental development.  May need special care in a neonatal intensive care unit (NICU), but this is rare. The length of the baby's stay in the hospital will depend on how quickly he or she progresses to a safe level of care.  Are at a greater risk for:  Infection.  Bleeding inside the brain.  Dying during their first year of life. REDUCING EARLY ELECTIVE BIRTHS Carrying a baby longer than 42 weeks of gestation is not good for the baby or the mother. A full-term pregnancy is best for baby and mother. Anything earlier can be risky for you and your baby. Remember:  An early elective birth may lead to a cesarean delivery. This can lead to other problems for the mother and baby.  An early elective birth can result in developmental problems for your child.  A baby's brain continues to develop while in the uterus.  A baby's body  continues to develop. The baby will be better able to breathe and eat when he or she is born near the due date.  A baby who stays in the uterus longer responds better. The baby will also bond better with you. Document Released: 10/26/2010 Document Revised: 06/30/2013 Document Reviewed: 09/12/2012 [facility] Patient Information 2015 [place], [place]. This information is not intended to replace advice given to you by your health care provider. Make sure you discuss any questions you have with your health care provider.

## 2013-10-14 NOTE — Discharge Summary (Signed)
Physician Discharge Summary  Patient ID: Sabrina Carney MRN: [id number] DOB/AGE: June 18, 1985 28 y.o.  Admit date: 10/13/2013 Discharge date: 10/14/2013  Admission Diagnoses:Gestational HTN  Discharge Diagnoses: Same Active Problems:   Pregnancy induced hypertension   Discharged Condition: good  Hospital Course: Pt admitted for observation due to elevated BPs and 36 weeks and increased edema.  Known to have Gest HTN.  Originally elevated BPs improved with rest and no further sxs of PIH.  Serial labs were normal and after 24 hours , BP was 129/66 with normal labs and sent home with f/u in 3 days  Consults: None  Significant Diagnostic Studies: labs: see chart  Treatments: BP and lab monitoring, and EFM  Discharge Exam: Blood pressure 146/100, pulse 78, temperature 97.6 F (36.4 C), temperature source Oral, resp. rate 20, height 5\' 10"  (1.778 m), weight 103.874 kg (229 lb). General appearance: alert, cooperative, appears stated age and no distress Abd Gravid nt, DTRs 1/4, no clonus, trace pedal edema  Disposition: 01-Home or Self Care  Discharge Instructions   Call MD for:  difficulty breathing, headache or visual disturbances    Complete by:  As directed      Call MD for:  persistant nausea and vomiting    Complete by:  As directed      Call MD for:  redness, tenderness, or signs of infection (pain, swelling, redness, odor or green/yellow discharge around incision site)    Complete by:  As directed      Call MD for:  severe uncontrolled pain    Complete by:  As directed      Call MD for:  temperature >100.4    Complete by:  As directed      Diet general    Complete by:  As directed      Discharge instructions    Complete by:  As directed   Modified Bedrest with pre eclampsia warnings Follow up in office in 3 days with Biophysical profile and BP check            Medication List         acetaminophen 500 MG tablet  Commonly known as:  TYLENOL  Take 1,000 mg by  mouth every 6 (six) hours as needed for headache.     prenatal multivitamin Tabs tablet  Take 1 tablet by mouth daily at 12 noon.         Signed: Ardeth Repetto C 10/14/2013, 9:11 AM

## 2013-10-14 NOTE — Progress Notes (Signed)
Pt given discharge instructions, pt states understanding with no needs or complaints at this time. Pt discharged in wheelchair with significant other.

## 2013-10-17 ENCOUNTER — Encounter (HOSPITAL_COMMUNITY): Payer: Self-pay | Admitting: *Deleted

## 2013-10-17 ENCOUNTER — Telehealth (HOSPITAL_COMMUNITY): Payer: Self-pay | Admitting: *Deleted

## 2013-10-17 NOTE — Telephone Encounter (Signed)
Preadmission screen  

## 2013-10-21 ENCOUNTER — Encounter (HOSPITAL_COMMUNITY): Payer: Self-pay

## 2013-10-21 ENCOUNTER — Inpatient Hospital Stay (HOSPITAL_COMMUNITY)
Admission: RE | Admit: 2013-10-21 | Discharge: 2013-10-26 | DRG: 775 | Disposition: A | Discharge status: Home / Self Care | Payer: BC Managed Care – PPO | Source: Ambulatory Visit | Attending: Obstetrics and Gynecology | Admitting: Obstetrics and Gynecology

## 2013-10-21 VITALS — BP 146/85 | HR 81 | Temp 97.3°F | Resp 18 | Ht 70.0 in | Wt 222.5 lb

## 2013-10-21 DIAGNOSIS — F3289 Other specified depressive episodes: Secondary | ICD-10-CM | POA: Diagnosis present

## 2013-10-21 DIAGNOSIS — N200 Calculus of kidney: Secondary | ICD-10-CM

## 2013-10-21 DIAGNOSIS — O139 Gestational [pregnancy-induced] hypertension without significant proteinuria, unspecified trimester: Secondary | ICD-10-CM | POA: Diagnosis present

## 2013-10-21 DIAGNOSIS — F329 Major depressive disorder, single episode, unspecified: Secondary | ICD-10-CM | POA: Diagnosis present

## 2013-10-21 DIAGNOSIS — O99344 Other mental disorders complicating childbirth: Secondary | ICD-10-CM | POA: Diagnosis present

## 2013-10-21 DIAGNOSIS — O133 Gestational [pregnancy-induced] hypertension without significant proteinuria, third trimester: Secondary | ICD-10-CM

## 2013-10-21 LAB — COMPREHENSIVE METABOLIC PANEL
ALT: 11 U/L (ref 0–35)
AST: 14 U/L (ref 0–37)
Albumin: 2.7 g/dL — ABNORMAL LOW (ref 3.5–5.2)
Alkaline Phosphatase: 121 U/L — ABNORMAL HIGH (ref 39–117)
Anion gap: 15 (ref 5–15)
BUN: 16 mg/dL (ref 6–23)
CO2: 20 meq/L (ref 19–32)
Calcium: 9.1 mg/dL (ref 8.4–10.5)
Chloride: 102 mEq/L (ref 96–112)
Creatinine, Ser: 0.72 mg/dL (ref 0.50–1.10)
GFR calc Af Amer: >90 mL/min (ref >90)
Glucose, Bld: 80 mg/dL (ref 70–99)
POTASSIUM: 4.1 meq/L (ref 3.7–5.3)
SODIUM: 137 meq/L (ref 137–147)
Total Protein: 6 g/dL (ref 6.0–8.3)

## 2013-10-21 LAB — CBC
HCT: 34.4 % — ABNORMAL LOW (ref 36.0–46.0)
Hemoglobin: 11.7 g/dL — ABNORMAL LOW (ref 12.0–15.0)
MCH: 31 pg (ref 26.0–34.0)
MCHC: 34 g/dL (ref 30.0–36.0)
MCV: 91 fL (ref 78.0–100.0)
PLATELETS: 144 10*3/uL — AB (ref 150–400)
RBC: 3.78 MIL/uL — AB (ref 3.87–5.11)
RDW: 13.3 % (ref 11.5–15.5)
WBC: 11.4 10*3/uL — ABNORMAL HIGH (ref 4.0–10.5)

## 2013-10-21 LAB — URIC ACID: Uric Acid, Serum: 7.8 mg/dL — ABNORMAL HIGH (ref 2.4–7.0)

## 2013-10-21 MED ORDER — ONDANSETRON HCL 4 MG/2ML IJ SOLN: 4.0000 mg | Freq: Four times a day (QID) | INTRAMUSCULAR | Status: DC | PRN

## 2013-10-21 MED ORDER — CITRIC ACID-SODIUM CITRATE 334-500 MG/5ML PO SOLN
30.0000 mL | ORAL | Status: DC | PRN
  Filled 2013-10-21: qty 15

## 2013-10-21 MED ORDER — FLEET ENEMA 7-19 GM/118ML RE ENEM: 1.0000 | ENEMA | RECTAL | Status: DC | PRN

## 2013-10-21 MED ORDER — ZOLPIDEM TARTRATE 5 MG PO TABS: 5.0000 mg | ORAL_TABLET | Freq: Every evening | ORAL | Status: DC | PRN

## 2013-10-21 MED ORDER — MISOPROSTOL 25 MCG QUARTER TABLET
25.0000 ug | ORAL_TABLET | ORAL | Status: DC | PRN
  Administered 2013-10-21 – 2013-10-22 (×3): 25 ug via VAGINAL
  Filled 2013-10-21: qty 0.25
  Filled 2013-10-21: qty 1
  Filled 2013-10-21 (×2): qty 0.25

## 2013-10-21 MED ORDER — TERBUTALINE SULFATE 1 MG/ML IJ SOLN: 0.2500 mg | Freq: Once | INTRAMUSCULAR | Status: AC | PRN

## 2013-10-21 MED ORDER — LACTATED RINGERS IV SOLN: 500.0000 mL | INTRAVENOUS | Status: DC | PRN

## 2013-10-21 MED ORDER — LACTATED RINGERS IV SOLN
INTRAVENOUS | Status: DC
  Administered 2013-10-21 – 2013-10-23 (×4): via INTRAVENOUS

## 2013-10-21 MED ORDER — BUTORPHANOL TARTRATE 1 MG/ML IJ SOLN
1.0000 mg | INTRAMUSCULAR | Status: DC | PRN
  Administered 2013-10-22 – 2013-10-23 (×2): 1 mg via INTRAVENOUS
  Filled 2013-10-21 (×2): qty 1

## 2013-10-21 MED ORDER — ACETAMINOPHEN 325 MG PO TABS: 650.0000 mg | ORAL_TABLET | ORAL | Status: DC | PRN

## 2013-10-21 MED ORDER — HYDROXYZINE HCL 50 MG PO TABS
50.0000 mg | ORAL_TABLET | Freq: Four times a day (QID) | ORAL | Status: DC | PRN
  Filled 2013-10-21: qty 1

## 2013-10-21 MED ORDER — LIDOCAINE HCL (PF) 1 % IJ SOLN
30.0000 mL | INTRAMUSCULAR | Status: DC | PRN
  Administered 2013-10-23: 30 mL via SUBCUTANEOUS
  Filled 2013-10-21: qty 30

## 2013-10-21 MED ORDER — OXYTOCIN BOLUS FROM INFUSION: 500.0000 mL | INTRAVENOUS | Status: DC

## 2013-10-21 MED ORDER — IBUPROFEN 600 MG PO TABS: 600.0000 mg | ORAL_TABLET | Freq: Four times a day (QID) | ORAL | Status: DC | PRN

## 2013-10-21 MED ORDER — OXYCODONE-ACETAMINOPHEN 5-325 MG PO TABS: 1.0000 | ORAL_TABLET | ORAL | Status: DC | PRN

## 2013-10-21 MED ORDER — OXYTOCIN 40 UNITS IN LACTATED RINGERS INFUSION - SIMPLE MED
62.5000 mL/h | INTRAVENOUS | Status: DC
  Administered 2013-10-23: 62.5 mL/h via INTRAVENOUS
  Filled 2013-10-21: qty 1000

## 2013-10-22 LAB — RPR

## 2013-10-22 MED ORDER — DIPHENHYDRAMINE HCL 50 MG/ML IJ SOLN: 12.5000 mg | INTRAMUSCULAR | Status: DC | PRN

## 2013-10-22 MED ORDER — PHENYLEPHRINE 40 MCG/ML (10ML) SYRINGE FOR IV PUSH (FOR BLOOD PRESSURE SUPPORT)
80.0000 ug | PREFILLED_SYRINGE | INTRAVENOUS | Status: DC | PRN
  Filled 2013-10-22: qty 10
  Filled 2013-10-22: qty 2

## 2013-10-22 MED ORDER — OXYTOCIN 40 UNITS IN LACTATED RINGERS INFUSION - SIMPLE MED
1.0000 m[IU]/min | INTRAVENOUS | Status: DC
Start: 2013-10-22 — End: 2013-10-23
  Administered 2013-10-22: 2 m[IU]/min via INTRAVENOUS

## 2013-10-22 MED ORDER — MAGNESIUM SULFATE 40 G IN LACTATED RINGERS - SIMPLE
2.0000 g/h | INTRAVENOUS | Status: DC
  Administered 2013-10-23: 2 g/h via INTRAVENOUS
  Filled 2013-10-22 (×2): qty 500

## 2013-10-22 MED ORDER — EPHEDRINE 5 MG/ML INJ
10.0000 mg | INTRAVENOUS | Status: DC | PRN
  Filled 2013-10-22: qty 4
  Filled 2013-10-22: qty 2

## 2013-10-22 MED ORDER — MAGNESIUM SULFATE BOLUS VIA INFUSION
4.0000 g | Freq: Once | INTRAVENOUS | Status: AC
  Administered 2013-10-22: 4 g via INTRAVENOUS
  Filled 2013-10-22: qty 500

## 2013-10-22 MED ORDER — TERBUTALINE SULFATE 1 MG/ML IJ SOLN: 0.2500 mg | Freq: Once | INTRAMUSCULAR | Status: AC | PRN

## 2013-10-22 MED ORDER — LACTATED RINGERS IV SOLN: 500.0000 mL | Freq: Once | INTRAVENOUS | Status: DC

## 2013-10-22 MED ORDER — FENTANYL 2.5 MCG/ML BUPIVACAINE 1/10 % EPIDURAL INFUSION (WH - ANES)
14.0000 mL/h | INTRAMUSCULAR | Status: DC | PRN
  Administered 2013-10-23: 14 mL/h via EPIDURAL
  Filled 2013-10-22 (×2): qty 125

## 2013-10-22 MED ORDER — PHENYLEPHRINE 40 MCG/ML (10ML) SYRINGE FOR IV PUSH (FOR BLOOD PRESSURE SUPPORT)
80.0000 ug | PREFILLED_SYRINGE | INTRAVENOUS | Status: DC | PRN
  Filled 2013-10-22: qty 2

## 2013-10-22 MED ORDER — EPHEDRINE 5 MG/ML INJ
10.0000 mg | INTRAVENOUS | Status: DC | PRN
  Filled 2013-10-22: qty 2

## 2013-10-22 NOTE — Consult Note (Signed)
Sabrina Carney, Sabrina Carney            ACCOUNT NO.:  [id number]  MEDICAL RECORD NO.:  [id number]  LOCATION:  9170                          FACILITY:  [facility]  PHYSICIAN:  Duke Salvia. Marcelle Overlie, M.D.DATE OF BIRTH:  Aug 24, 1985  DATE OF CONSULTATION: DATE OF DISCHARGE:                                CONSULTATION   CHIEF COMPLAINT:  Mild preeclampsia at term.  HPI:  A 28 year old, G1, P0, EDD November 11, 2013, with a mild preeclampsia presents for labor induction.  The patient was noted to have BP 152/100 with 1+ protein on October 17, 2013, ultrasound then [facility] 8/8, noted to be vertex with [facility] labs that were obtained and were normal.  She presents now for 2 stage labor induction. Her 1 hour GTT was normal at 86.  PAST MEDICAL HISTORY:  Blood type is A positive.  Pregnancy was conceived with donor sperm IUI at [facility].  For the remainder of her past medical history, please see the [facility] form for details which include prior history of depression, not currently on antidepressants.  PHYSICAL EXAMINATION:  VITAL SIGNS:  Temp 98.2, blood pressure 152/100. HEENT:  Unremarkable. NECK:  Supple without masses. LUNGS:  Clear. CARDIOVASCULAR:  Regular rate and rhythm without murmurs, rubs, gallop sounds. BREASTS:  Not examined, 37 cm fundal height, vertex.  Fetal heart rate 140, cervix was 1, thick, -2. EXTREMITIES:  Revealed 1+ edema.  Reflexes 1 to 2+.  No clonus.  IMPRESSION:  A 37-week intrauterine pregnancy, mild pregnancy-induced hypertension.  PLAN:  Two-stage labor induction.     Erland Vivas M. Marcelle Overlie, M.D.     RM[facility]/MEDQ  D:  10/22/2013  T:  10/22/2013  Job:  [id number]

## 2013-10-22 NOTE — H&P (Signed)
Sabrina Carney  DICTATION # [id number] CSN# [id number]   [name], MD 10/22/2013 11:08 AM

## 2013-10-22 NOTE — Progress Notes (Signed)
Has rec'd Cytotec X 3, only irreg ctx, now with cx 1-2/50/ballot vtx>>>discussed rest and restart Cytotec or continuing with pitocin, she prefers pi per protocol, FHR cat 1 /stable BP

## 2013-10-22 NOTE — H&P (Signed)
  28 yo G3P0 at 72 0/7 weeks with gestational hypertension presents for IOL  PMH/PSH/OBH see prenatal record  BP 150s/90s FHT reactive UCs irregular  A: Gestational Hypertension  P: Two Stage induction of labor      Labs

## 2013-10-22 NOTE — Progress Notes (Signed)
Patient ID: Sabrina Carney, female   DOB: May 06, 1985, 28 y.o.   MRN: [id number] [redacted]w[redacted]d   rec'd 1 dose cytotec last night, now 1/long/fltg vtx, FHR cat 1 >>will rpt cytotec X 1//

## 2013-10-23 ENCOUNTER — Encounter (HOSPITAL_COMMUNITY): Payer: BC Managed Care – PPO | Admitting: Anesthesiology

## 2013-10-23 ENCOUNTER — Inpatient Hospital Stay (HOSPITAL_COMMUNITY): Payer: BC Managed Care – PPO | Admitting: Anesthesiology

## 2013-10-23 ENCOUNTER — Encounter (HOSPITAL_COMMUNITY): Payer: Self-pay

## 2013-10-23 LAB — CBC
HCT: 36 % (ref 36.0–46.0)
HEMATOCRIT: 35.6 % — AB (ref 36.0–46.0)
HEMOGLOBIN: 12.3 g/dL (ref 12.0–15.0)
HEMOGLOBIN: 12.3 g/dL (ref 12.0–15.0)
MCH: 31.6 pg (ref 26.0–34.0)
MCH: 31.5 pg (ref 26.0–34.0)
MCHC: 34.6 g/dL (ref 30.0–36.0)
MCHC: 34.2 g/dL (ref 30.0–36.0)
MCV: 91.5 fL (ref 78.0–100.0)
MCV: 92.1 fL (ref 78.0–100.0)
Platelets: 149 10*3/uL — ABNORMAL LOW (ref 150–400)
Platelets: 154 10*3/uL (ref 150–400)
RBC: 3.89 MIL/uL (ref 3.87–5.11)
RBC: 3.91 MIL/uL (ref 3.87–5.11)
RDW: 13.4 % (ref 11.5–15.5)
RDW: 13.5 % (ref 11.5–15.5)
WBC: 17.6 10*3/uL — AB (ref 4.0–10.5)
WBC: 23.9 10*3/uL — ABNORMAL HIGH (ref 4.0–10.5)

## 2013-10-23 MED ORDER — MEDROXYPROGESTERONE ACETATE 150 MG/ML IM SUSP: 150.0000 mg | INTRAMUSCULAR | Status: DC | PRN

## 2013-10-23 MED ORDER — DIPHENHYDRAMINE HCL 25 MG PO CAPS: 25.0000 mg | ORAL_CAPSULE | Freq: Four times a day (QID) | ORAL | Status: DC | PRN

## 2013-10-23 MED ORDER — MEASLES, MUMPS & RUBELLA VAC ~~LOC~~ INJ: 0.5000 mL | INJECTION | Freq: Once | SUBCUTANEOUS | Status: DC

## 2013-10-23 MED ORDER — TETANUS-DIPHTH-ACELL PERTUSSIS 5-2.5-18.5 LF-MCG/0.5 IM SUSP: 0.5000 mL | Freq: Once | INTRAMUSCULAR | Status: DC

## 2013-10-23 MED ORDER — WITCH HAZEL-GLYCERIN EX PADS: 1.0000 "application " | MEDICATED_PAD | CUTANEOUS | Status: DC | PRN

## 2013-10-23 MED ORDER — SENNOSIDES-DOCUSATE SODIUM 8.6-50 MG PO TABS
2.0000 | ORAL_TABLET | ORAL | Status: DC
  Administered 2013-10-23: 2 via ORAL
  Filled 2013-10-23: qty 2

## 2013-10-23 MED ORDER — ONDANSETRON HCL 4 MG/2ML IJ SOLN
4.0000 mg | INTRAMUSCULAR | Status: DC | PRN
Start: 2013-10-23 — End: 2013-10-27

## 2013-10-23 MED ORDER — ONDANSETRON HCL 4 MG PO TABS: 4.0000 mg | ORAL_TABLET | ORAL | Status: DC | PRN

## 2013-10-23 MED ORDER — LANOLIN HYDROUS EX OINT: TOPICAL_OINTMENT | CUTANEOUS | Status: DC | PRN

## 2013-10-23 MED ORDER — BENZOCAINE-MENTHOL 20-0.5 % EX AERO: 1.0000 "application " | INHALATION_SPRAY | CUTANEOUS | Status: DC | PRN

## 2013-10-23 MED ORDER — IBUPROFEN 600 MG PO TABS
600.0000 mg | ORAL_TABLET | Freq: Four times a day (QID) | ORAL | Status: DC
  Administered 2013-10-23 – 2013-10-26 (×12): 600 mg via ORAL
  Filled 2013-10-23 (×12): qty 1

## 2013-10-23 MED ORDER — SIMETHICONE 80 MG PO CHEW: 80.0000 mg | CHEWABLE_TABLET | ORAL | Status: DC | PRN

## 2013-10-23 MED ORDER — OXYCODONE-ACETAMINOPHEN 5-325 MG PO TABS
1.0000 | ORAL_TABLET | ORAL | Status: DC | PRN
Start: 2013-10-23 — End: 2013-10-27
  Administered 2013-10-24: 1 via ORAL
  Filled 2013-10-23: qty 1

## 2013-10-23 MED ORDER — DIBUCAINE 1 % RE OINT: 1.0000 "application " | TOPICAL_OINTMENT | RECTAL | Status: DC | PRN

## 2013-10-23 MED ORDER — LACTATED RINGERS IV SOLN
INTRAVENOUS | Status: DC
  Administered 2013-10-24: 03:00:00 via INTRAVENOUS

## 2013-10-23 MED ORDER — ZOLPIDEM TARTRATE 5 MG PO TABS: 5.0000 mg | ORAL_TABLET | Freq: Every evening | ORAL | Status: DC | PRN

## 2013-10-23 MED ORDER — LIDOCAINE HCL (PF) 1 % IJ SOLN
INTRAMUSCULAR | Status: DC | PRN
  Administered 2013-10-23 (×4): 4 mL
  Administered 2013-10-23: 2 mL

## 2013-10-23 MED ORDER — LABETALOL HCL 200 MG PO TABS
200.0000 mg | ORAL_TABLET | Freq: Two times a day (BID) | ORAL | Status: DC
  Administered 2013-10-23 – 2013-10-26 (×6): 200 mg via ORAL
  Filled 2013-10-23 (×8): qty 1

## 2013-10-23 MED ORDER — PRENATAL MULTIVITAMIN CH
1.0000 | ORAL_TABLET | Freq: Every day | ORAL | Status: DC
  Administered 2013-10-24 – 2013-10-26 (×3): 1 via ORAL
  Filled 2013-10-23 (×3): qty 1

## 2013-10-23 NOTE — Anesthesia Preprocedure Evaluation (Signed)
Anesthesia Evaluation  Patient identified by MRN, date of birth, ID band Patient awake    Reviewed: Allergy & Precautions, H&P , NPO status , Patient's Chart, lab work & pertinent test results, reviewed documented beta blocker date and time   History of Anesthesia Complications Negative for: history of anesthetic complications  Airway Mallampati: I TM Distance: >3 FB Neck ROM: full    Dental  (+) Teeth Intact   Pulmonary neg pulmonary ROS, former smoker,  breath sounds clear to auscultation        Cardiovascular hypertension (preeclampsia on magnesium), Rhythm:regular Rate:Normal     Neuro/Psych Seizures - (last was 6 years ago, no meds),  Depression    GI/Hepatic negative GI ROS, Neg liver ROS,   Endo/Other  BMI 32.9  Renal/GU kidney stones     Musculoskeletal   Abdominal   Peds  Hematology negative hematology ROS (+)   Anesthesia Other Findings   Reproductive/Obstetrics (+) Pregnancy                           Anesthesia Physical Anesthesia Plan  ASA: II  Anesthesia Plan: Epidural   Post-op Pain Management:    Induction:   Airway Management Planned:   Additional Equipment:   Intra-op Plan:   Post-operative Plan:   Informed Consent: I have reviewed the patients History and Physical, chart, labs and discussed the procedure including the risks, benefits and alternatives for the proposed anesthesia with the patient or authorized representative who has indicated his/her understanding and acceptance.     Plan Discussed with:   Anesthesia Plan Comments:         Anesthesia Quick Evaluation

## 2013-10-23 NOTE — Progress Notes (Signed)
Patient ID: Sabrina Carney, female   DOB: Jul 26, 1985, 28 y.o.   MRN: [id number] Pt now comfortable with epidural VSSAF BPs 130-140s/70-80s FHR 130s + accels  ? Variables Ctxs q 2-3 difficult to monitor  Cx 3/c/-2  AROM clear IUPC/FSE placed DTRs 1/4  + scalp stim  Gest HTN/Preeclampsia- AROM and Pitocin Stable Anticipate SVD

## 2013-10-23 NOTE — Anesthesia Procedure Notes (Signed)
Epidural Patient location during procedure: OB Start time: 10/23/2013 9:36 AM  Staffing Anesthesiologist: Sovereign Ramiro Performed by: anesthesiologist   Preanesthetic Checklist Completed: patient identified, site marked, surgical consent, pre-op evaluation, timeout performed, IV checked, risks and benefits discussed and monitors and equipment checked  Epidural Patient position: sitting Prep: site prepped and draped and DuraPrep Patient monitoring: continuous pulse ox and blood pressure Approach: midline Location: L3-L4 Injection technique: LOR air  Needle:  Needle type: Tuohy  Needle gauge: 17 G Needle length: 9 cm and 9 Needle insertion depth: 5 cm cm Catheter type: closed end flexible Catheter size: 19 Gauge Catheter at skin depth: 10 cm Test dose: negative  Assessment Events: blood not aspirated, injection not painful, no injection resistance, negative IV test and no paresthesia  Additional Notes Discussed risk of headache, infection, bleeding, nerve injury and failed or incomplete block.  Patient voices understanding and wishes to proceed.  Epidural placed on first attempt.  No paresthesia.  Patient tolerated procedure well with coaching.  No apparent complications.  Jasmine December, MDReason for block:procedure for pain

## 2013-10-24 LAB — COMPREHENSIVE METABOLIC PANEL
ALBUMIN: 2.1 g/dL — AB (ref 3.5–5.2)
ALK PHOS: 97 U/L (ref 39–117)
ALT: 9 U/L (ref 0–35)
AST: 13 U/L (ref 0–37)
Anion gap: 9 (ref 5–15)
BUN: 11 mg/dL (ref 6–23)
CO2: 24 mEq/L (ref 19–32)
Calcium: 6.7 mg/dL — ABNORMAL LOW (ref 8.4–10.5)
Chloride: 105 mEq/L (ref 96–112)
Creatinine, Ser: 0.69 mg/dL (ref 0.50–1.10)
GFR calc Af Amer: >90 mL/min (ref >90)
GFR calc non Af Amer: >90 mL/min (ref >90)
Glucose, Bld: 102 mg/dL — ABNORMAL HIGH (ref 70–99)
POTASSIUM: 4 meq/L (ref 3.7–5.3)
Sodium: 138 mEq/L (ref 137–147)
TOTAL PROTEIN: 5 g/dL — AB (ref 6.0–8.3)
Total Bilirubin: <0.2 mg/dL — ABNORMAL LOW (ref 0.3–1.2)

## 2013-10-24 LAB — CBC
HCT: 29 % — ABNORMAL LOW (ref 36.0–46.0)
HCT: 27.7 % — ABNORMAL LOW (ref 36.0–46.0)
HEMOGLOBIN: 9.2 g/dL — AB (ref 12.0–15.0)
Hemoglobin: 9.6 g/dL — ABNORMAL LOW (ref 12.0–15.0)
MCH: 30.9 pg (ref 26.0–34.0)
MCH: 31.1 pg (ref 26.0–34.0)
MCHC: 33.1 g/dL (ref 30.0–36.0)
MCHC: 33.2 g/dL (ref 30.0–36.0)
MCV: 93.2 fL (ref 78.0–100.0)
MCV: 93.6 fL (ref 78.0–100.0)
PLATELETS: 120 10*3/uL — AB (ref 150–400)
PLATELETS: 120 10*3/uL — AB (ref 150–400)
RBC: 3.11 MIL/uL — ABNORMAL LOW (ref 3.87–5.11)
RBC: 2.96 MIL/uL — ABNORMAL LOW (ref 3.87–5.11)
RDW: 13.7 % (ref 11.5–15.5)
RDW: 13.9 % (ref 11.5–15.5)
WBC: 14.3 10*3/uL — AB (ref 4.0–10.5)
WBC: 12.4 10*3/uL — ABNORMAL HIGH (ref 4.0–10.5)

## 2013-10-24 MED ORDER — ACETAMINOPHEN 500 MG PO TABS
1000.0000 mg | ORAL_TABLET | Freq: Four times a day (QID) | ORAL | Status: DC | PRN
  Administered 2013-10-24: 1000 mg via ORAL
  Filled 2013-10-24: qty 2

## 2013-10-24 NOTE — Anesthesia Postprocedure Evaluation (Signed)
  Anesthesia Post-op Note  Patient: Sabrina Carney  Procedure(s) Performed: * No procedures listed *  Patient Location: [facility]  Anesthesia Type:Epidural  Level of Consciousness: awake, alert , oriented and patient cooperative  Airway and Oxygen Therapy: Patient Spontanous Breathing  Post-op Pain: mild  Post-op Assessment: Patient's Cardiovascular Status Stable, Respiratory Function Stable, No headache, No backache, No residual numbness and No residual motor weakness  Post-op Vital Signs: stable  Last Vitals:  Filed Vitals:   10/24/13 0700  BP: 115/69  Pulse: 82  Temp:   Resp:     Complications: No apparent anesthesia complications

## 2013-10-24 NOTE — Lactation Note (Signed)
Lactation Consultation Note Mom sleeping, mother of pt. At bedside and didn't want me to wake pt. Up. Set up of DEBP and cleaning supplies given. Briefly went over information w/ pt. Mom.  Patient Name: Sabrina Carney [id number] Date: 10/24/2013     Maternal Data    Feeding    LATCH Score/Interventions                      Lactation Tools Discussed/Used     Consult Status      Sabrina Carney, Diamond Nickel 10/24/2013, 7:25 AM

## 2013-10-24 NOTE — Progress Notes (Signed)
Post Partum Day 1 Subjective: no complaints, up ad lib and voiding  Objective: Blood pressure 115/69, pulse 82, temperature 98.3 F (36.8 C), temperature source Oral, resp. rate 16, height  (1.778 m), weight 100.415 kg (221 lb 6 oz), SpO2 98.00%, unknown if currently breastfeeding.  Physical Exam:  General: alert, cooperative and appears stated age 28: appropriate Uterine Fundus: firm Incision: healing well, no significant drainage, no dehiscence, no significant erythema DVT Evaluation: No evidence of DVT seen on physical exam.   Recent Labs  10/23/13 1522 10/24/13 0518  HGB 12.3 9.6*  HCT 36.0 29.0*    Assessment/Plan: Plan for discharge tomorrow Discontinue Magnesium Continue Labetalol Recheck CBC and check CMET at noon today - if labs are stable then recommend transfer to regular room Baby in NICU due to blood sugar issues   LOS: 3 days   Johnnisha Forton L 10/24/2013, 8:33 AM

## 2013-10-24 NOTE — Lactation Note (Signed)
This note was copied from the chart of Sabrina Jeannette Maddy. Lactation Consultation Note     Follow up consult with this mom of a NICU baby, now 37 3/7 weeks CGA, and now 24 hours old. Mom was on her way to the nICU, and has been pumping, but not expressing. I showed mom how to hand express, and she was able to collect  a few drops of colostrum..   Patient Name: Sabrina Carney [id number] Date: 10/24/2013 Reason for consult: Follow-up assessment;NICU baby;Infant < 6lbs   Maternal Data    Feeding Feeding Type: Formula Nipple Type: Slow - flow Length of feed: 20 min  LATCH Score/Interventions                      Lactation Tools Discussed/Used     Consult Status Consult Status: Follow-up Date: 10/25/13 Follow-up type: In-patient    Alfred Levins 10/24/2013, 5:53 PM

## 2013-10-24 NOTE — Progress Notes (Signed)
UR chart review completed.  

## 2013-10-25 LAB — COMPREHENSIVE METABOLIC PANEL
ALK PHOS: 99 U/L (ref 39–117)
ALT: 8 U/L (ref 0–35)
ALT: 12 U/L (ref 0–35)
ANION GAP: 8 (ref 5–15)
AST: 11 U/L (ref 0–37)
AST: 13 U/L (ref 0–37)
Albumin: 1.9 g/dL — ABNORMAL LOW (ref 3.5–5.2)
Albumin: 2.3 g/dL — ABNORMAL LOW (ref 3.5–5.2)
Alkaline Phosphatase: 80 U/L (ref 39–117)
Anion gap: 11 (ref 5–15)
BUN: 13 mg/dL (ref 6–23)
BUN: 13 mg/dL (ref 6–23)
CALCIUM: 8 mg/dL — AB (ref 8.4–10.5)
CO2: 23 meq/L (ref 19–32)
CO2: 23 mEq/L (ref 19–32)
Calcium: 7.2 mg/dL — ABNORMAL LOW (ref 8.4–10.5)
Chloride: 106 mEq/L (ref 96–112)
Chloride: 107 mEq/L (ref 96–112)
Creatinine, Ser: 0.72 mg/dL (ref 0.50–1.10)
Creatinine, Ser: 0.72 mg/dL (ref 0.50–1.10)
GFR calc Af Amer: >90 mL/min (ref >90)
GFR calc non Af Amer: >90 mL/min (ref >90)
GLUCOSE: 96 mg/dL (ref 70–99)
GLUCOSE: 89 mg/dL (ref 70–99)
POTASSIUM: 4.5 meq/L (ref 3.7–5.3)
Potassium: 3.8 mEq/L (ref 3.7–5.3)
SODIUM: 141 meq/L (ref 137–147)
Sodium: 137 mEq/L (ref 137–147)
TOTAL PROTEIN: 4.7 g/dL — AB (ref 6.0–8.3)
TOTAL PROTEIN: 5.5 g/dL — AB (ref 6.0–8.3)
Total Bilirubin: <0.2 mg/dL — ABNORMAL LOW (ref 0.3–1.2)
Total Bilirubin: <0.2 mg/dL — ABNORMAL LOW (ref 0.3–1.2)

## 2013-10-25 LAB — CBC
HCT: 29.2 % — ABNORMAL LOW (ref 36.0–46.0)
HEMATOCRIT: 26.6 % — AB (ref 36.0–46.0)
HEMOGLOBIN: 8.6 g/dL — AB (ref 12.0–15.0)
HEMOGLOBIN: 9.6 g/dL — AB (ref 12.0–15.0)
MCH: 30.7 pg (ref 26.0–34.0)
MCH: 31.3 pg (ref 26.0–34.0)
MCHC: 32.3 g/dL (ref 30.0–36.0)
MCHC: 32.9 g/dL (ref 30.0–36.0)
MCV: 95 fL (ref 78.0–100.0)
MCV: 95.1 fL (ref 78.0–100.0)
PLATELETS: 111 10*3/uL — AB (ref 150–400)
Platelets: 97 10*3/uL — ABNORMAL LOW (ref 150–400)
RBC: 2.8 MIL/uL — AB (ref 3.87–5.11)
RBC: 3.07 MIL/uL — ABNORMAL LOW (ref 3.87–5.11)
RDW: 13.9 % (ref 11.5–15.5)
RDW: 13.8 % (ref 11.5–15.5)
WBC: 7.1 10*3/uL (ref 4.0–10.5)
WBC: 9.1 10*3/uL (ref 4.0–10.5)

## 2013-10-25 MED ORDER — BUTALBITAL-APAP-CAFFEINE 50-325-40 MG PO TABS
2.0000 | ORAL_TABLET | ORAL | Status: DC | PRN
  Administered 2013-10-25 – 2013-10-26 (×4): 2 via ORAL
  Filled 2013-10-25 (×4): qty 2

## 2013-10-25 NOTE — Progress Notes (Signed)
Post Partum Day 2 Subjective: Complains of headache behind left eye. been there since yesterday am. Percocet helped a little but it made her feel sick. No scotomata. Baby in NICU.  Objective: Blood pressure 129/73, pulse 80, temperature 98 F (36.7 C), temperature source Oral, resp. rate 18, height  (1.778 m), weight 101.606 kg (224 lb), SpO2 98.00%, unknown if currently breastfeeding.  Physical Exam:  General: alert, cooperative and appears stated age Lochia: appropriate Uterine Fundus: firm Incision: healing well, no significant drainage, no dehiscence, no significant erythema DVT Evaluation: No evidence of DVT seen on physical exam.   Recent Labs  10/24/13 1210 10/25/13 0530  HGB 9.2* 8.6*  HCT 27.7* 26.6*    Assessment/Plan: Preeclampsia Platelet count 97,000 today and normal LFTs. BP is normal on Labetalol.  Extensive discussion with patient - I will recheck platelet count at noon. If it is not stable I will restart Magnesium until platelet count improves. Try Fioricet for headache but will monitor closely. Discharge possible tomorrow if symptoms resolve. Plan of care reviewed with patient's nurse.   LOS: 4 days   Judd Mccubbin L 10/25/2013, 7:50 AM

## 2013-10-25 NOTE — Plan of Care (Signed)
Problem: Phase I Progression Outcomes Goal: VS, stable, temp < 100.4 degrees F Outcome: Completed/Met Date Met:  10/25/13 VSS at this time.  Problem: Phase II Progression Outcomes Goal: Pain controlled on oral analgesia Outcome: Completed/Met Date Met:  10/25/13 Pain controlled on po Motrin and Percocet. Goal: Progress activity as tolerated unless otherwise ordered Outcome: Completed/Met Date Met:  10/25/13 Patient walks in the hall frequently and tolerates well. Goal: Afebrile, VS remain stable Outcome: Completed/Met Date Met:  10/25/13 Bp stable at this time.Afebrile.

## 2013-10-25 NOTE — Plan of Care (Signed)
Problem: Phase I Progression Outcomes Goal: Other Phase I Outcomes/Goals Outcome: Completed/Met Date Met:  10/25/13 Blood pressure stable on po Labetalol Fioricet is working well for her headaches,DTR's WNL.LFT's stable.

## 2013-10-26 MED ORDER — LABETALOL HCL 200 MG PO TABS: 200.0000 mg | ORAL_TABLET | Freq: Two times a day (BID) | ORAL | Status: DC

## 2013-10-26 MED ORDER — LOPERAMIDE HCL 2 MG PO CAPS
2.0000 mg | ORAL_CAPSULE | ORAL | Status: DC | PRN
  Administered 2013-10-26: 2 mg via ORAL
  Filled 2013-10-26: qty 1

## 2013-10-26 MED ORDER — IBUPROFEN 600 MG PO TABS: 600.0000 mg | ORAL_TABLET | Freq: Four times a day (QID) | ORAL | Status: DC

## 2013-10-26 NOTE — Progress Notes (Signed)
Post Partum Day 3 Subjective: no complaints  Objective: Blood pressure 146/85, pulse 81, temperature 97.3 F (36.3 C), temperature source Oral, resp. rate 18, height  (1.778 m), weight 100.925 kg (222 lb 8 oz), SpO2 98.00%, unknown if currently breastfeeding.  Physical Exam:  General: alert, cooperative and appears stated age Lochia: appropriate Uterine Fundus: firm Incision: healing well, no significant drainage, no dehiscence, no significant erythema DVT Evaluation: No evidence of DVT seen on physical exam.   Recent Labs  10/25/13 0530 10/25/13 1248  HGB 8.6* 9.6*  HCT 26.6* 29.2*    Assessment/Plan: Discharge home   LOS: 5 days   Kymari Nuon L 10/26/2013, 8:08 AM

## 2013-10-26 NOTE — Discharge Summary (Signed)
Obstetric Discharge Summary Reason for Admission: induction of labor Prenatal Procedures: none Intrapartum Procedures: spontaneous vaginal delivery Postpartum Procedures: none Complications-Operative and Postpartum: 2nd degree perineal laceration Hemoglobin  Date Value Ref Range Status  10/25/2013 9.6* 12.0 - 15.0 g/dL Final     HCT  Date Value Ref Range Status  10/25/2013 29.2* 36.0 - 46.0 % Final    Physical Exam:  General: alert, cooperative and appears stated age 28: appropriate Uterine Fundus: firm Incision: healing well, no significant drainage, no dehiscence, no significant erythema DVT Evaluation: No evidence of DVT seen on physical exam.  Discharge Diagnoses: Term Pregnancy-delivered and Preelampsia  Discharge Information: Date: 10/26/2013 Activity: pelvic rest Diet: routine Medications: Ibuprofen and labetalol Condition: improved Instructions: refer to practice specific booklet Discharge to: home   Newborn Data: Live born female  Birth Weight: 5 lb 4.8 oz (2404 g) APGAR: 8, 10  Home with mother.  Nayson Traweek L 10/26/2013, 8:09 AM

## 2013-10-26 NOTE — Progress Notes (Addendum)
Discharge instructions provided to patient and significant other at bedside.  Activity, medications, when to call the doctor, follow up appointments and community resources discussed.  No questions at this time.  Prescriptions given to patient.  Osvaldo Angst, RN-----------------

## 2013-10-27 ENCOUNTER — Ambulatory Visit: Payer: Self-pay

## 2013-10-27 NOTE — Progress Notes (Signed)
Patient was discharged to home around 2230 all d/c instructions given on day shift,parents stayed later to spend time with son in NICU.

## 2013-10-27 NOTE — Lactation Note (Signed)
This note was copied from the chart of Sabrina Carney. Lactation Consultation Note    Follow up consult with this mom of a NICU baby, now 61 days old, and 37 6/7 weekc CGA. Mom has sore nipples from flanges that were too large. I decreased mom to size 21 flanges, and mom reports thewe more comfortable. Mom is adding coconut oil to flange also. Mom was able to pump 15 mls today. i advised ehr increse her frequency of pumping to every 2-3 hours, try power pumping, and always add hand expressin. I will meet with mom and her wife tomorrow, at 3 pm, to work on [employer] the baby.   Patient Name: Sabrina Miryah Ralls [id number] Date: 10/27/2013 Reason for consult: Follow-up assessment;NICU baby;Infant < 6lbs   Maternal Data    Feeding Feeding Type: Bottle Fed - Formula Nipple Type: Regular Length of feed: 20 min  LATCH Score/Interventions                      Lactation Tools Discussed/Used Pump Review: Setup, frequency, and cleaning;Other (comment) (increase duration 15-30, add hand expression each time)   Consult Status Consult Status: Follow-up Date: 10/28/13 Follow-up type: In-patient    Alfred Levins 10/27/2013, 3:47 PM

## 2013-10-28 ENCOUNTER — Ambulatory Visit: Payer: Self-pay

## 2013-10-28 NOTE — Lactation Note (Signed)
This note was copied from the chart of Sabrina Carney. Lactation Consultation Note      Follow up consult with this mom and baby, now 75 days old, and 38 weeks CGA, weighing 5 lbs 10.7 ounces. Mom has a low milk supply - expressing about 15 mls every 3 hours. Flavia Shipper is rooming in with mom and mom tonight. I set up an SNS for mom to try . Brayden did well, but he only transfered 26 mls of Neosure 22 formula in 45 minutes. He was too tiered to take any friom the bottle after this. I suggested mom use the SNs not more than every other feeding, and when she does, to limit Brayden's time at the breast to 20-30 minutes, and then offer bottle, if he will take  It. I advised mom to pump every 3 hours, and make sure Brayden get her EBM prior to formula, either by bottle or SNS, and then supplement with formula, either by bottle or SNS. I also told mom that if she is too stressed or tired, to let mom's partner bottle feed, while she pumps. Mom has an o/p appointment for lactation on Friday, 9/4 at 1 pm.   Patient Name: Sabrina Belem Hintze [id number] Date: 10/28/2013 Reason for consult: Follow-up assessment;NICU baby;Infant < 6lbs   Maternal Data    Feeding Feeding Type: Breast Fed Nipple Type: Regular Length of feed: 45 min  LATCH Score/Interventions Latch: Repeated attempts needed to sustain latch, nipple held in mouth throughout feeding, stimulation needed to elicit sucking reflex. Intervention(s): Skin to skin;Teach feeding cues;Waking techniques Intervention(s): Adjust position;Assist with latch;Breast massage;Breast compression  Audible Swallowing: A few with stimulation  Type of Nipple: Everted at rest and after stimulation  Comfort (Breast/Nipple): Soft / non-tender     Hold (Positioning): Assistance needed to correctly position infant at breast and maintain latch. Intervention(s): Breastfeeding basics reviewed;Support Pillows;Position options;Skin to skin  LATCH Score: 7  Lactation  Tools Discussed/Used     Consult Status Consult Status: Follow-up Date: 10/31/13 Follow-up type: Out-patient    Alfred Levins 10/28/2013, 4:19 PM

## 2013-10-30 NOTE — Progress Notes (Signed)
Post discharge chart review completed per insurance co request. 

## 2013-10-31 ENCOUNTER — Ambulatory Visit (HOSPITAL_COMMUNITY)
Admission: RE | Admit: 2013-10-31 | Discharge: 2013-10-31 | Disposition: A | Discharge status: Home / Self Care | Payer: BC Managed Care – PPO | Source: Ambulatory Visit | Attending: Obstetrics and Gynecology | Admitting: Obstetrics and Gynecology

## 2013-10-31 NOTE — Lactation Note (Signed)
Lactation Consultation Note  Sabrina Carney's BW 5 lb 4.8 oz  Weight today (8 days) 5 lb 10.7 oz.  He has gained over an ounce in the past 24 hours.  Sabrina Carney is here today to learn new techniques and have questions answered.  SHe is being treated for PIH with labatelol.  Her milk supply is low.  Sabrina Carney is BF every 3-4 hours and Lively pumps after that.  The yield is 10 - 20 ml.  He recieves a supplement of which averages 45 ml after BF.  WHen expressed BM is not available he recieves neo sure.   Today Sabrina Carney is hungry he latches to the breast but quickly falls asleep.  It is noted that he has a labial frenum that inserts close to the alveolar ridge making it difficult to flange his upper lip and a band of thick tissue submucosally under the base of the tongue.  He does not have a strong vacuum when the gloved finger is inserted deeply into his mouth.  He also has a high palate.  He has difficulty grabbing the breast and when he does chewing is noted.  He quickly falls asleep.  Mom reports that he did not BF in the hospital and that it has been improving since discharge.    His total transfer was 10 ml and he received a supplement of  41 ml.   Mom was able to Post pump about 40 ml.  Over about 30 minutes.  We also used hands on pumping.   Plan is to help mom to recover from [facility] and increase her milk supply and for Sabrina Carney to gain weight.  Plan: Feed Sabrina Carney every 4 hours (minimum) and try to supplement a little less so that he wakes easier.   Offer supplement if he gets frustrated or stops engaging Breast massage and post pump for 15" Power pump if desired Water to thirst Oats Rest   Mom will call insurance to see if another consult is covered.   Patient Name: Sabrina Carney [id number] Date: 10/31/2013     Maternal Data    Feeding    LATCH Score/Interventions                      Lactation Tools Discussed/Used     Consult Status      Soyla Dryer 10/31/2013, 4:32  PM

## 2013-12-29 ENCOUNTER — Encounter (HOSPITAL_COMMUNITY): Payer: Self-pay

## 2016-01-12 ENCOUNTER — Encounter: Payer: Self-pay | Admitting: Physician Assistant

## 2016-01-12 ENCOUNTER — Ambulatory Visit (INDEPENDENT_AMBULATORY_CARE_PROVIDER_SITE_OTHER): Payer: 59 | Admitting: Physician Assistant

## 2016-01-12 VITALS — BP 120/70 | HR 68 | Temp 97.8°F | Resp 14 | Ht 69.0 in | Wt 185.0 lb

## 2016-01-12 DIAGNOSIS — Z0001 Encounter for general adult medical examination with abnormal findings: Secondary | ICD-10-CM | POA: Diagnosis not present

## 2016-01-12 DIAGNOSIS — R5383 Other fatigue: Secondary | ICD-10-CM | POA: Insufficient documentation

## 2016-01-12 DIAGNOSIS — F32A Depression, unspecified: Secondary | ICD-10-CM | POA: Insufficient documentation

## 2016-01-12 DIAGNOSIS — Z79899 Other long term (current) drug therapy: Secondary | ICD-10-CM

## 2016-01-12 DIAGNOSIS — R6889 Other general symptoms and signs: Secondary | ICD-10-CM | POA: Diagnosis not present

## 2016-01-12 DIAGNOSIS — M79641 Pain in right hand: Secondary | ICD-10-CM | POA: Insufficient documentation

## 2016-01-12 DIAGNOSIS — E559 Vitamin D deficiency, unspecified: Secondary | ICD-10-CM | POA: Diagnosis not present

## 2016-01-12 DIAGNOSIS — Z1389 Encounter for screening for other disorder: Secondary | ICD-10-CM

## 2016-01-12 DIAGNOSIS — F3160 Bipolar disorder, current episode mixed, unspecified: Secondary | ICD-10-CM | POA: Insufficient documentation

## 2016-01-12 DIAGNOSIS — F329 Major depressive disorder, single episode, unspecified: Secondary | ICD-10-CM | POA: Insufficient documentation

## 2016-01-12 DIAGNOSIS — Z13220 Encounter for screening for lipoid disorders: Secondary | ICD-10-CM

## 2016-01-12 DIAGNOSIS — Z131 Encounter for screening for diabetes mellitus: Secondary | ICD-10-CM

## 2016-01-12 DIAGNOSIS — M791 Myalgia, unspecified site: Secondary | ICD-10-CM

## 2016-01-12 LAB — LIPID PANEL
CHOL/HDL RATIO: 2.3 ratio (ref <5.0)
CHOLESTEROL: 175 mg/dL (ref <200)
HDL: 76 mg/dL (ref >50)
LDL CALC: 81 mg/dL (ref <100)
TRIGLYCERIDES: 92 mg/dL (ref <150)
VLDL: 18 mg/dL (ref <30)

## 2016-01-12 LAB — BASIC METABOLIC PANEL WITH GFR
BUN: 9 mg/dL (ref 7–25)
CO2: 25 mmol/L (ref 20–31)
Calcium: 9.6 mg/dL (ref 8.6–10.2)
Chloride: 103 mmol/L (ref 98–110)
Creat: 0.83 mg/dL (ref 0.50–1.10)
GLUCOSE: 91 mg/dL (ref 65–99)
POTASSIUM: 4.1 mmol/L (ref 3.5–5.3)
SODIUM: 138 mmol/L (ref 135–146)

## 2016-01-12 LAB — CBC WITH DIFFERENTIAL/PLATELET
BASOS PCT: 1 %
Basophils Absolute: 73 cells/uL (ref 0–200)
EOS PCT: 3 %
Eosinophils Absolute: 219 cells/uL (ref 15–500)
HEMATOCRIT: 43 % (ref 35.0–45.0)
Hemoglobin: 14 g/dL (ref 11.7–15.5)
Lymphocytes Relative: 30 %
Lymphs Abs: 2190 cells/uL (ref 850–3900)
MCH: 30 pg (ref 27.0–33.0)
MCHC: 32.6 g/dL (ref 32.0–36.0)
MCV: 92.1 fL (ref 80.0–100.0)
MONO ABS: 657 {cells}/uL (ref 200–950)
MPV: 11.3 fL (ref 7.5–12.5)
Monocytes Relative: 9 %
Neutro Abs: 4161 cells/uL (ref 1500–7800)
Neutrophils Relative %: 57 %
PLATELETS: 266 10*3/uL (ref 140–400)
RBC: 4.67 MIL/uL (ref 3.80–5.10)
RDW: 12.6 % (ref 11.0–15.0)
WBC: 7.3 10*3/uL (ref 3.8–10.8)

## 2016-01-12 LAB — IRON AND TIBC
%SAT: 15 % (ref 11–50)
Iron: 58 ug/dL (ref 40–190)
TIBC: 395 ug/dL (ref 250–450)
UIBC: 337 ug/dL (ref 125–400)

## 2016-01-12 LAB — HEPATIC FUNCTION PANEL
ALT: 11 U/L (ref 6–29)
AST: 14 U/L (ref 10–30)
Albumin: 4.5 g/dL (ref 3.6–5.1)
Alkaline Phosphatase: 77 U/L (ref 33–115)
BILIRUBIN INDIRECT: 0.3 mg/dL (ref 0.2–1.2)
BILIRUBIN TOTAL: 0.3 mg/dL (ref 0.2–1.2)
Bilirubin, Direct: 0 mg/dL (ref <=0.2)
Total Protein: 7.2 g/dL (ref 6.1–8.1)

## 2016-01-12 LAB — TSH: TSH: 0.98 mIU/L

## 2016-01-12 LAB — MAGNESIUM: Magnesium: 2 mg/dL (ref 1.5–2.5)

## 2016-01-12 LAB — CK: Total CK: 80 U/L (ref 7–177)

## 2016-01-12 MED ORDER — DIVALPROEX SODIUM 125 MG PO DR TAB: 125.0000 mg | DELAYED_RELEASE_TABLET | Freq: Two times a day (BID) | ORAL | 0 refills | Status: DC

## 2016-01-12 NOTE — Progress Notes (Signed)
Complete Physical  Assessment and Plan: 1. Right hand pain Rule out boxer fracture, ice, rest, aleve - DG Hand Complete Right; Future  2. Depression, unspecified depression type Will go to ER with any suicidal/homicial ideations Start on depakote low dose, follow up here 1 month, call 2 weeks.   3. Other fatigue ? From depression/anxiety/bipolar Has some evidence of sleep apnea, will consider sleep study Check labs - CBC with Differential/Platelet - BASIC METABOLIC PANEL WITH GFR - Hepatic function panel - TSH - Iron and TIBC - Ferritin - Vitamin B12  4. Muscle ache - CK - Sedimentation rate  5. Screening cholesterol level - Lipid panel  6. Screening for diabetes mellitus - Hemoglobin A1c  7. Medication management - Magnesium  8. Vitamin D deficiency - VITAMIN D 25 Hydroxy (Vit-D Deficiency, Fractures)  9. Screening for blood or protein in urine - Urinalysis, Routine w reflex microscopic ([facility]) - Microalbumin / creatinine urine ratio  10. Mixed bipolar I disorder (HCC) Start depakote, will slowly increase, needs to see counseling Has thought of suicide but has no plan and states will not due to her son Patient understands she needs to go to the ER with any thoughts of self harm or homicide  . Discussed med's effects and SE's. Screening labs and tests as requested with regular follow-up as recommended. Over 40 minutes of exam, counseling, chart review and critical decision making was performed  HPI  This very nice 30 y.o.female presents for complete physical.  Patient has no major health issues.  Patient reports no complaints at this time.  Patient is married to Towmichelle, braden 30 year old.  Patient has had depression, possible bipolar dx in the past, has tried to kill her self, has been hospitalzied once for 5 days, 9 years ago, has tried lamictal, zoloft.  Has had insomnia in the past, but recently has been waking up frequently, gasping, and has been  having night terrors, will snore, can now sleep anytime/anywhere.  Having muscle spasms bilateral calfes + GERD, occ HA worse with recent increase in alcohol.  Goiter Punched wall 9 30 last night right hand, has pain at left pinky.     Current Medications:  No current outpatient prescriptions on file prior to visit.   No current facility-administered medications on file prior to visit.    Health Maintenance:   There is no immunization history for the selected administration types on file for this patient.  TD/TDAP: 2015 Influenza: declines Pneumovax:  Prevnar 13:  HPV vaccines: N/A  LMP: Patient's last menstrual period was 01/12/2016. Pap: 2015, never abnormal, due 2018 MGM: :N/A  Allergies:  Allergies  Allergen Reactions  . Hydrocodone Nausea And Vomiting  . Other Rash    Nitrile exam gloves   Medical History:  has Kidney stones; Pregnancy induced hypertension; and Gestational hypertension on her problem list. Surgical History:  She  has a past surgical history that includes Hand surgery and Wisdom tooth extraction. Family History:  Her family history includes Depression in her father, mother, and sister; Diabetes in her paternal grandmother; Heart disease in her maternal grandfather and maternal uncle; Hypertension in her maternal grandmother; Mental illness in her brother and father; Migraines in her maternal grandmother and mother; Stroke in her maternal grandmother. Social History:   reports that she quit smoking about 4 years ago. She has never used smokeless tobacco. She reports that she drinks alcohol. She reports that she does not use drugs.  Review of Systems: Review of  Systems  Constitutional: Positive for malaise/fatigue. Negative for chills, diaphoresis and fever.  HENT: Positive for congestion (x 2-3 days).   Eyes: Negative.   Respiratory: Negative.  Negative for cough and sputum production.   Cardiovascular: Negative.  Negative for chest pain and  palpitations.  Gastrointestinal: Positive for constipation, diarrhea and heartburn. Negative for abdominal pain, blood in stool, melena, nausea and vomiting.  Genitourinary: Negative.   Musculoskeletal: Negative.   Skin: Negative.   Neurological: Positive for headaches. Negative for dizziness, tingling, tremors, sensory change, speech change, focal weakness, seizures, loss of consciousness and weakness.  Psychiatric/Behavioral: Positive for depression, memory loss and suicidal ideas. Negative for hallucinations and substance abuse. The patient is nervous/anxious and has insomnia.     Physical Exam: Estimated body mass index is 27.32 kg/m as calculated from the following:   Height as of this encounter: 5\' 9"  (1.753 m).   Weight as of this encounter: 185 lb (83.9 kg). BP 120/70   Pulse 68   Temp 97.8 F (36.6 C) (Temporal)   Resp 14   Ht 5\' 9"  (1.753 m)   Wt 185 lb (83.9 kg)   LMP 01/12/2016   Breastfeeding? No   BMI 27.32 kg/m  Physical Exam  Constitutional: She appears well-developed and well-nourished. No distress.  HENT:  Head: Normocephalic and atraumatic.  Mouth/Throat: Oropharynx is clear and moist. No oropharyngeal exudate.  Crowded mouth  Eyes: Conjunctivae are normal. Pupils are equal, round, and reactive to light.  Neck: Normal range of motion. Neck supple. Thyromegaly present. No thyroid mass present.  Pulmonary/Chest: Effort normal and breath sounds normal.  Abdominal: Soft. Bowel sounds are normal. There is no tenderness.  Musculoskeletal: She exhibits no edema or deformity.  Right 5th digit with pain on palpation of MTP, pain wil manipulation, reduced grip due to pain, normal distal neurovascular exam.   Neurological: She displays normal reflexes. No cranial nerve deficit. She exhibits normal muscle tone. Coordination normal.  Skin: Skin is warm and dry.  Scars bilateral arms from self cutting/burning  Psychiatric: Her mood appears anxious. Her affect is labile.  Her affect is not angry, not blunt and not inappropriate. Her speech is not rapid and/or pressured and not slurred. She is withdrawn. Thought content is not paranoid and not delusional. Cognition and memory are normal. She exhibits a depressed mood. She expresses suicidal ideation. She expresses no homicidal ideation. She expresses no suicidal plans and no homicidal plans.  Judgement intact    EKG: defer  Quentin MullingAmanda Collier 2:21 PM [facility]

## 2016-01-12 NOTE — Patient Instructions (Addendum)
Please take the depakote 1 at night for 1 week, then once in the morning and one at night for 2 weeks, then take 2 at night 1 in the morning for 1 week, and 2 morning and 2 at night for 1 week, we will send in the 250 mg for you to take 1 pill twice a day if you do well.  If you do not do well please call Please set up counseling.   I think it is possible that you have sleep apnea. It can cause interrupted sleep, headaches, frequent awakenings, fatigue, dry mouth, fast/slow heart beats, memory issues, anxiety/depression, swelling,  numbness tingling hands/feet, weight gain, shortness of breath, and the list goes on. Sleep apnea needs to be ruled out because if it is left untreated it does eventually lead to abnormal heart beats, lung failure or heart failure as well as increasing the risk of heart attack and stroke. There are masks you can wear OR a mouth piece that I can give you information about. Often times though people feel MUCH better after getting treatment.   Sleep Apnea  Sleep apnea is a sleep disorder characterized by abnormal pauses in breathing while you sleep. When your breathing pauses, the level of oxygen in your blood decreases. This causes you to move out of deep sleep and into light sleep. As a result, your quality of sleep is poor, and the system that carries your blood throughout your body (cardiovascular system) experiences stress. If sleep apnea remains untreated, the following conditions can develop:  High blood pressure (hypertension).  Coronary artery disease.  Inability to achieve or maintain an erection (impotence).  Impairment of your thought process (cognitive dysfunction). There are three types of sleep apnea: 1. Obstructive sleep apnea--Pauses in breathing during sleep because of a blocked airway. 2. Central sleep apnea--Pauses in breathing during sleep because the area of the brain that controls your breathing does not send the correct signals to the muscles that  control breathing. 3. Mixed sleep apnea--A combination of both obstructive and central sleep apnea.  RISK FACTORS The following risk factors can increase your risk of developing sleep apnea:  Being overweight.  Smoking.  Having narrow passages in your nose and throat.  Being of older age.  Being female.  Alcohol use.  Sedative and tranquilizer use.  Ethnicity. Among individuals younger than 35 years, African Americans are at increased risk of sleep apnea. SYMPTOMS   Difficulty staying asleep.  Daytime sleepiness and fatigue.  Loss of energy.  Irritability.  Loud, heavy snoring.  Morning headaches.  Trouble concentrating.  Forgetfulness.  Decreased interest in sex. DIAGNOSIS  In order to diagnose sleep apnea, your caregiver will perform a physical examination. Your caregiver may suggest that you take a home sleep test. Your caregiver may also recommend that you spend the night in a sleep lab. In the sleep lab, several monitors record information about your heart, lungs, and brain while you sleep. Your leg and arm movements and blood oxygen level are also recorded. TREATMENT The following actions may help to resolve mild sleep apnea:  Sleeping on your side.   Using a decongestant if you have nasal congestion.   Avoiding the use of depressants, including alcohol, sedatives, and narcotics.   Losing weight and modifying your diet if you are overweight. There also are devices and treatments to help open your airway:  Oral appliances. These are custom-made mouthpieces that shift your lower jaw forward and slightly open your bite. This opens your airway.  Devices that create positive airway pressure. This positive pressure "splints" your airway open to help you breathe better during sleep. The following devices create positive airway pressure:  Continuous positive airway pressure (CPAP) device. The CPAP device creates a continuous level of air pressure with an air  pump. The air is delivered to your airway through a mask while you sleep. This continuous pressure keeps your airway open.  Nasal expiratory positive airway pressure (EPAP) device. The EPAP device creates positive air pressure as you exhale. The device consists of single-use valves, which are inserted into each nostril and held in place by adhesive. The valves create very little resistance when you inhale but create much more resistance when you exhale. That increased resistance creates the positive airway pressure. This positive pressure while you exhale keeps your airway open, making it easier to breath when you inhale again.  Bilevel positive airway pressure (BPAP) device. The BPAP device is used mainly in patients with central sleep apnea. This device is similar to the CPAP device because it also uses an air pump to deliver continuous air pressure through a mask. However, with the BPAP machine, the pressure is set at two different levels. The pressure when you exhale is lower than the pressure when you inhale.  Surgery. Typically, surgery is only done if you cannot comply with less invasive treatments or if the less invasive treatments do not improve your condition. Surgery involves removing excess tissue in your airway to create a wider passage way. Document Released: 02/03/2002 Document Revised: 06/10/2012 Document Reviewed: 06/22/2011 [facility] Patient Information 2015 [place], [place]. This information is not intended to replace advice given to you by your health care provider. Make sure you discuss any questions you have with your health care provider.   Counseling services  Dr. Vilinda FlakeEdward Lurey, Ph.D. [address] [place] [address] Phone: [phone]  Catharine Dowda, WyomingM.Ed [phone] B. [address], [place] [address]   [facility] Hours: Monday-Thursday 830-8pm  Friday 830AM-7PM Address: [address] [phone]  [facility].  Address: [address]     [place], [place] [address] Phone-[phone]  [facility] [phone] office [email or web address] [address] [place], [place], [place] [address]  Gale JourneyLaura Atkinson, therapist  Franchot ErichsenErik Nelson, MA, clinical psychologist  Cognitive-Behavior Therapy; Mood Disorders; Anxiety Disorders; adult and child ADHD; Family Therapy; Stress Management; personal growth, and Marital Therapy.    Carlus Pavlovennis McKnight Ph.D., clinical psychologist Cognitive-Behavior Therapy; Mood Disorders; Anxiety Disorders; Stress     Management  [facility] [address], [place], [place] [address] [phone]   [facility] Sheran LuzDesiree Wilkinson, psychotherapist [address] [place], [place] [address] [phone]  Miguel AschoffElaine Talbert Ph.D., clinical psychologist [phone] office [address] [place], [place] [address] Cognitive Behavior Therapy, Depression, Bipolar, Anxiety, Grief and Loss

## 2016-01-13 LAB — URINALYSIS, ROUTINE W REFLEX MICROSCOPIC
Bilirubin Urine: NEGATIVE
GLUCOSE, UA: NEGATIVE
Hgb urine dipstick: NEGATIVE
Ketones, ur: NEGATIVE
LEUKOCYTES UA: NEGATIVE
Nitrite: NEGATIVE
PH: 7 (ref 5.0–8.0)
Protein, ur: NEGATIVE
Specific Gravity, Urine: 1.013 (ref 1.001–1.035)

## 2016-01-13 LAB — VITAMIN D 25 HYDROXY (VIT D DEFICIENCY, FRACTURES): Vit D, 25-Hydroxy: 28 ng/mL — ABNORMAL LOW (ref 30–100)

## 2016-01-13 LAB — HEMOGLOBIN A1C
Hgb A1c MFr Bld: 5 % (ref <5.7)
Mean Plasma Glucose: 97 mg/dL

## 2016-01-13 LAB — SEDIMENTATION RATE: SED RATE: 1 mm/h (ref 0–20)

## 2016-01-13 LAB — FERRITIN: Ferritin: 18 ng/mL (ref 10–154)

## 2016-01-13 LAB — MICROALBUMIN / CREATININE URINE RATIO
Creatinine, Urine: 74 mg/dL (ref 20–320)
MICROALB UR: 0.3 mg/dL
MICROALB/CREAT RATIO: 4 ug/mg{creat} (ref <30)

## 2016-01-13 LAB — VITAMIN B12: Vitamin B-12: 625 pg/mL (ref 200–1100)

## 2016-01-14 ENCOUNTER — Other Ambulatory Visit: Payer: Self-pay | Admitting: Physician Assistant

## 2016-01-14 DIAGNOSIS — G4730 Sleep apnea, unspecified: Principal | ICD-10-CM

## 2016-01-14 DIAGNOSIS — G479 Sleep disorder, unspecified: Secondary | ICD-10-CM

## 2016-01-14 DIAGNOSIS — G4710 Hypersomnia, unspecified: Secondary | ICD-10-CM

## 2016-02-09 ENCOUNTER — Encounter: Payer: Self-pay | Admitting: Physician Assistant

## 2016-02-09 ENCOUNTER — Ambulatory Visit (INDEPENDENT_AMBULATORY_CARE_PROVIDER_SITE_OTHER): Payer: 59 | Admitting: Physician Assistant

## 2016-02-09 VITALS — BP 120/80 | HR 88 | Temp 97.7°F | Resp 16 | Ht 69.0 in | Wt 191.8 lb

## 2016-02-09 DIAGNOSIS — F32A Depression, unspecified: Secondary | ICD-10-CM

## 2016-02-09 DIAGNOSIS — F329 Major depressive disorder, single episode, unspecified: Secondary | ICD-10-CM | POA: Diagnosis not present

## 2016-02-09 MED ORDER — CITALOPRAM HYDROBROMIDE 20 MG PO TABS: 20.0000 mg | ORAL_TABLET | Freq: Every day | ORAL | 2 refills | Status: DC

## 2016-02-09 MED ORDER — DIVALPROEX SODIUM 250 MG PO DR TAB: 250.0000 mg | DELAYED_RELEASE_TABLET | Freq: Two times a day (BID) | ORAL | 3 refills | Status: DC

## 2016-02-09 NOTE — Progress Notes (Signed)
Assessment and Plan: 1. Depression, unspecified depression type Continue therapy, no S/HI, add low dose of celexa, call in 2 weeks, close follow up 4 weeks, no copay at that visit, add vitamin D 5000 IU daily - divalproex (DEPAKOTE) 250 MG DR tablet; Take 1 tablet (250 mg total) by mouth 2 (two) times daily.  Dispense: 60 tablet; Refill: 3 - citalopram (CELEXA) 20 MG tablet; Take 1 tablet (20 mg total) by mouth daily.  Dispense: 30 tablet; Refill: 2    HPI 30 y.o.female presents for follow up for depression/anger issues. Everything was normal with her labs other than her Vitamin D, she also had a negative apnea link. She was started on depakote for mood. She is on 250mg  BID, and has been seeing a Veterinary surgeoncounselor. She is still having depression/anxiety but states that her moods are more stable, she will start intensive dbt therapy   Past Medical History:  Diagnosis Date  . Depression    not on meds, doing well  . Hypertension    During pregnancy 2015  . Infection    UTI  . Kidney stones    with infections  . Migraines   . Seizures (HCC)    related to heat or not eating     Allergies  Allergen Reactions  . Hydrocodone Nausea And Vomiting  . Other Rash    Nitrile exam gloves    No current outpatient prescriptions on file prior to visit.   No current facility-administered medications on file prior to visit.     ROS: all negative except above.   Physical Exam: Filed Weights   02/09/16 1446  Weight: 191 lb 12.8 oz (87 kg)   BP 120/80   Pulse 88   Temp 97.7 F (36.5 C)   Resp 16   Ht 5\' 9"  (1.753 m)   Wt 191 lb 12.8 oz (87 kg)   LMP 02/04/2016   SpO2 98%   BMI 28.32 kg/m  General Appearance: Well nourished, in no apparent distress. Eyes: PERRLA, EOMs, conjunctiva no swelling or erythema Sinuses: No Frontal/maxillary tenderness ENT/Mouth: Ext aud canals clear, TMs without erythema, bulging. No erythema, swelling, or exudate on post pharynx.  Tonsils not swollen or  erythematous. Hearing normal.  Neck: Supple, thyroid normal.  Respiratory: Respiratory effort normal, BS equal bilaterally without rales, rhonchi, wheezing or stridor.  Cardio: RRR with no MRGs. Brisk peripheral pulses without edema.  Abdomen: Soft, + BS.  Non tender, no guarding, rebound, hernias, masses. Lymphatics: Non tender without lymphadenopathy.  Musculoskeletal: Full ROM, 5/5 strength, normal gait.  Skin: Warm, dry without rashes, lesions, ecchymosis.  Neuro: Cranial nerves intact. Normal muscle tone, no cerebellar symptoms. Sensation intact.  Psych: Awake and oriented X 3, normal affect, Insight and Judgment appropriate.     Quentin MullingAmanda Chimaobi Casebolt, PA-C 5:06 PM [facility]

## 2016-02-09 NOTE — Patient Instructions (Signed)
  Vitamin D goal is between 60-80  GET ON 5000 IU VITAMIN A DAY  Please make sure that you are taking your Vitamin D as directed.   It is very important as a natural anti-inflammatory   helping hair, skin, and nails, as well as reducing stroke and heart attack risk.   It helps your bones and helps with mood.  It also decreases numerous cancer risks so please take it as directed.   Low Vit D is associated with a 200-300% higher risk for CANCER   and 200-300% higher risk for HEART   ATTACK  &  STROKE.    .....................................Marland Kitchen.  It is also associated with higher death rate at younger ages,   autoimmune diseases like Rheumatoid arthritis, Lupus, Multiple Sclerosis.     Also many other serious conditions, like depression, Alzheimer's  Dementia, infertility, muscle aches, fatigue, fibromyalgia - just to name a few.  +++++++++++++++++++  Can get liquid vitamin D from [place]  OR here [place] at  [facility] [address], [place], [place] [address]

## 2016-03-22 ENCOUNTER — Encounter: Payer: Self-pay | Admitting: Physician Assistant

## 2016-03-22 ENCOUNTER — Ambulatory Visit (INDEPENDENT_AMBULATORY_CARE_PROVIDER_SITE_OTHER): Payer: 59 | Admitting: Physician Assistant

## 2016-03-22 VITALS — BP 102/70 | HR 73 | Temp 97.3°F | Resp 16 | Ht 69.0 in | Wt 190.4 lb

## 2016-03-22 DIAGNOSIS — F329 Major depressive disorder, single episode, unspecified: Secondary | ICD-10-CM

## 2016-03-22 DIAGNOSIS — F32A Depression, unspecified: Secondary | ICD-10-CM

## 2016-03-22 MED ORDER — VORTIOXETINE HBR 20 MG PO TABS: 20.0000 mg | ORAL_TABLET | Freq: Every day | ORAL | 3 refills | Status: DC

## 2016-03-22 NOTE — Patient Instructions (Addendum)
Call your insurance and get some names of counselors in your network Call Baker and give her the names and see if she recognizes anyone or has a preference Get on the trintellix, do 5mg  daily for 1 weeks,then go to 10mg  daily  Suicidal Feelings: How to Help Yourself Suicide is the taking of one's own life. If you feel as though life is getting too tough to handle and are thinking about suicide, get help right away. To get help:  Call your local emergency services (911 in the [place]  Call a suicide hotline to speak with a trained counselor who understands how you are feeling. The following is a list of suicide hotlines in the [place]. For a list of hotlines in [place], visit InkDistributor.it.  1-800-273-TALK (726)886-1284).  1-800-SUICIDE 475-172-5530).  484-636-1792. This is a hotline for Spanish speakers.  4-696-295-2WUX 223-679-5811). This is a hotline for TTY users.  1-866-4-U-TREVOR 8047460663). This is a hotline for lesbian, gay, bisexual, transgender, or questioning youth.  Contact a crisis center or a local suicide prevention center. To find a crisis center or suicide prevention center:  Call your local hospital, clinic, community service organization, mental health center, social service provider, or health department. Ask for assistance in connecting to a crisis center.  Visit https://www.patel-king.com/ for a list of crisis centers in the [place], or visit www.suicideprevention.ca/thinking-about-suicide/find-a-crisis-centre for a list of centers in [place].  Visit the following websites:  National Suicide Prevention Lifeline: www.suicidepreventionlifeline.org  Hopeline: www.hopeline.com  [employer] for Suicide Prevention: https://www.ayers.com/  [employer] (for lesbian, gay, bisexual, transgender, or questioning youth): www.thetrevorproject.org How can I help  myself feel better?  Promise yourself that you will not do anything drastic when you have suicidal feelings. Remember, there is hope. Many people have gotten through suicidal thoughts and feelings, and you will, too. You may have gotten through them before, and this proves that you can get through them again.  Let family, friends, teachers, or counselors know how you are feeling. Try not to isolate yourself from those who care about you. Remember, they will want to help you. Talk with someone every day, even if you do not feel sociable. Face-to-face conversation is best.  Call a mental health professional and see one regularly.  Visit your primary health care provider every year.  Eat a well-balanced diet, and space your meals so you eat regularly.  Get plenty of rest.  Avoid alcohol and drugs, and remove them from your home. They will only make you feel worse.  If you are thinking of taking a lot of medicine, give your medicine to someone who can give it to you one day at a time. If you are on antidepressants and are concerned you will overdose, let your health care provider know so he or she can give you safer medicines. Ask your mental health professional about the possible side effects of any medicines you are taking.  Remove weapons, poisons, knives, and anything else that could harm you from your home.  Try to stick to routines. Follow a schedule every day. Put self-care on your schedule.  Make a list of realistic goals, and cross them off when you achieve them. Accomplishments give a sense of worth.  Wait until you are feeling better before doing the things you find difficult or unpleasant.  Exercise if you are able. You will feel better if you exercise for even a half hour each day.  Go out in the sun or into nature. This will  help you recover from depression faster. If you have a favorite place to walk, go there.  Do the things that have always given you pleasure. Play your  favorite music, read a good book, paint a picture, play your favorite instrument, or do anything else that takes your mind off your depression if it is safe to do.  Keep your living space well lit.  When you are feeling well, write yourself a letter about tips and support that you can read when you are not feeling well.  Remember that life's difficulties can be sorted out with help. Conditions can be treated. You can work on thoughts and strategies that serve you well. This information is not intended to replace advice given to you by your health care provider. Make sure you discuss any questions you have with your health care provider. Document Released: 08/20/2002 Document Revised: 10/13/2015 Document Reviewed: 06/10/2013 Elsevier Interactive Patient Education  2017 Elsevier Inc.   Vitamin D goal is between 60-80  Please make sure that you are taking your Vitamin D as directed.   It is very important as a natural anti-inflammatory   helping hair, skin, and nails, as well as reducing stroke and heart attack risk.   It helps your bones and helps with mood.  We want you on at least 5000 IU daily  It also decreases numerous cancer risks so please take it as directed.   Low Vit D is associated with a 200-300% higher risk for CANCER   and 200-300% higher risk for HEART   ATTACK  &  STROKE.    .....................................Marland Kitchen.  It is also associated with higher death rate at younger ages,   autoimmune diseases like Rheumatoid arthritis, Lupus, Multiple Sclerosis.     Also many other serious conditions, like depression, Alzheimer's  Dementia, infertility, muscle aches, fatigue, fibromyalgia - just to name a few.  +++++++++++++++++++  Can get liquid vitamin D from [place]  OR here [place] at  [facility] [address], [place], [place] [address] Or you can try earth fare

## 2016-03-22 NOTE — Progress Notes (Signed)
Assessment and Plan: 1. Depression, unspecified depression type Continue therapy, + SI no HI, she is cutting/burning at this time but denies plan for suicide, declines in patient work up at this time but given number to call if worsening suicidal thoughts, will stop celexa and add on trintellix with samples, continue depakote for now, ? Need seroquel at night, close follow up 3 weeks, no copay at that visit, add vitamin D 5000 IU daily   HPI 31 y.o.female presents for follow up for depression/anger issues. Everything was normal with her labs other than her Vitamin D, she also had a negative apnea link. She was started on depakote for mood which she states helped stablize her, then celexa low dose was added on and she is seeing a counselor but has not started DBT therapy since not in her network  She is not sleeping lately for past couple of days, always with racing thoughts. She will burn herself, did it last Friday. States she also used razor blade to cut left breast.    Past Medical History:  Diagnosis Date  . Depression    not on meds, doing well  . Hypertension    During pregnancy 2015  . Infection    UTI  . Kidney stones    with infections  . Migraines   . Seizures (HCC)    related to heat or not eating     Allergies  Allergen Reactions  . Hydrocodone Nausea And Vomiting  . Other Rash    Nitrile exam gloves    Current Outpatient Prescriptions on File Prior to Visit  Medication Sig  . divalproex (DEPAKOTE) 250 MG DR tablet Take 1 tablet (250 mg total) by mouth 2 (two) times daily.   No current facility-administered medications on file prior to visit.     ROS: all negative except above.   Physical Exam: Filed Weights   03/22/16 1459  Weight: 190 lb 6.4 oz (86.4 kg)   BP 102/70   Pulse 73   Temp 97.3 F (36.3 C)   Resp 16   Ht 5\' 9"  (1.753 m)   Wt 190 lb 6.4 oz (86.4 kg)   LMP 02/23/2016   SpO2 98%   BMI 28.12 kg/m  General Appearance: Well nourished, in no  apparent distress. Eyes: PERRLA, EOMs, conjunctiva no swelling or erythema Sinuses: No Frontal/maxillary tenderness ENT/Mouth: Ext aud canals clear, TMs without erythema, bulging. No erythema, swelling, or exudate on post pharynx.  Tonsils not swollen or erythematous. Hearing normal.  Neck: Supple, thyroid normal.  Respiratory: Respiratory effort normal, BS equal bilaterally without rales, rhonchi, wheezing or stridor.  Cardio: RRR with no MRGs. Brisk peripheral pulses without edema.  Abdomen: Soft, + BS.  Non tender, no guarding, rebound, hernias, masses. Lymphatics: Non tender without lymphadenopathy.  Musculoskeletal: Full ROM, 5/5 strength, normal gait.  Skin: Healing burn and superficial linear healing horizontal marks on left flank/breast. Warm, dry without rashes, lesions, ecchymosis.  Neuro: Cranial nerves intact. Normal muscle tone, no cerebellar symptoms. Sensation intact.  Psych: Awake and oriented X 3, mood depressed, + SI but states no plan and will not due to Orinda KennerBrayden     Ramiz Turpin, PA-C 5:12 PM [facility]

## 2016-03-29 ENCOUNTER — Telehealth: Payer: Self-pay | Admitting: Physician Assistant

## 2016-03-29 NOTE — Telephone Encounter (Signed)
Patient started on trintellix, asked to call back to the office, called to state she is doing well, has done 1 week of the of 5mg  and has been feeling better, less on edge, less suicidal ideations. She will increase to the 10mg  tomorrow, if any worsening thoughts of suicide will go the ER and or call the hotline given to her last visit. She has close follow up in 3 weeks.

## 2016-04-12 ENCOUNTER — Ambulatory Visit (INDEPENDENT_AMBULATORY_CARE_PROVIDER_SITE_OTHER): Payer: 59 | Admitting: Physician Assistant

## 2016-04-12 ENCOUNTER — Encounter: Payer: Self-pay | Admitting: Physician Assistant

## 2016-04-12 DIAGNOSIS — F32A Depression, unspecified: Secondary | ICD-10-CM

## 2016-04-12 DIAGNOSIS — F329 Major depressive disorder, single episode, unspecified: Secondary | ICD-10-CM | POA: Diagnosis not present

## 2016-04-12 NOTE — Progress Notes (Signed)
Assessment and Plan: Depression, unspecified depression type Continue medications, doing better, call 2 weeks, follow up 6 weeks.  Call if any SI/HI or go to ER, continue counseling Denies SI/HI at this time  HPI 31 y.o.female presents for follow up for depression/anxiety.  Currently trial separation [facility], getting joint custody of son who is 2, mainly michelle gets him at night, she is staying with her mom which is a struggle. Got full time job offer.   Past Medical History:  Diagnosis Date  . Depression    not on meds, doing well  . Hypertension    During pregnancy 2015  . Infection    UTI  . Kidney stones    with infections  . Migraines   . Seizures (HCC)    related to heat or not eating     Allergies  Allergen Reactions  . Hydrocodone Nausea And Vomiting  . Other Rash    Nitrile exam gloves    Current Outpatient Prescriptions on File Prior to Visit  Medication Sig  . divalproex (DEPAKOTE) 250 MG DR tablet Take 1 tablet (250 mg total) by mouth 2 (two) times daily.  Marland Kitchen. vortioxetine HBr (TRINTELLIX) 20 MG TABS Take 20 mg by mouth daily.   No current facility-administered medications on file prior to visit.     ROS: all negative except above.   Physical Exam: Filed Weights   04/12/16 1556  Weight: 190 lb (86.2 kg)   BP 122/90   Pulse 82   Temp 97.3 F (36.3 C)   Resp 16   Ht 5\' 9"  (1.753 m)   Wt 190 lb (86.2 kg)   LMP 03/25/2016   SpO2 97%   BMI 28.06 kg/m  General Appearance: Well nourished, in no apparent distress. Eyes: PERRLA, EOMs, conjunctiva no swelling or erythema Sinuses: No Frontal/maxillary tenderness ENT/Mouth: Ext aud canals clear, TMs without erythema, bulging. No erythema, swelling, or exudate on post pharynx.  Tonsils not swollen or erythematous. Hearing normal.  Neck: Supple, thyroid normal.  Respiratory: Respiratory effort normal, BS equal bilaterally without rales, rhonchi, wheezing or stridor.  Cardio: RRR with no MRGs. Brisk  peripheral pulses without edema.  Abdomen: Soft, + BS.  Non tender, no guarding, rebound, hernias, masses. Lymphatics: Non tender without lymphadenopathy.  Musculoskeletal: Full ROM, 5/5 strength, normal gait.  Skin: Warm, dry without rashes, lesions, ecchymosis.  Neuro: Cranial nerves intact. Normal muscle tone, no cerebellar symptoms. Sensation intact.  Psych: Awake and oriented X 3, normal affect, Insight and Judgment appropriate.     Sabrina MullingAmanda Damein Gaunce, PA-C 4:21 PM [facility]

## 2016-04-12 NOTE — Patient Instructions (Signed)
Give me a call at the office in 2 weeks Call if you need anything in the mean time

## 2016-05-05 ENCOUNTER — Ambulatory Visit (INDEPENDENT_AMBULATORY_CARE_PROVIDER_SITE_OTHER): Payer: 59 | Admitting: Internal Medicine

## 2016-05-05 ENCOUNTER — Encounter: Payer: Self-pay | Admitting: Internal Medicine

## 2016-05-05 VITALS — BP 128/86 | HR 92 | Temp 98.2°F | Resp 98 | Ht 69.0 in

## 2016-05-05 DIAGNOSIS — J069 Acute upper respiratory infection, unspecified: Secondary | ICD-10-CM

## 2016-05-05 MED ORDER — BENZONATATE 100 MG PO CAPS: 100.0000 mg | ORAL_CAPSULE | Freq: Four times a day (QID) | ORAL | 1 refills | Status: DC | PRN

## 2016-05-05 MED ORDER — AZITHROMYCIN 250 MG PO TABS: ORAL_TABLET | ORAL | 0 refills | Status: DC

## 2016-05-05 MED ORDER — FLUTICASONE PROPIONATE 50 MCG/ACT NA SUSP: 2.0000 | Freq: Every day | NASAL | 0 refills | Status: DC

## 2016-05-05 MED ORDER — PROMETHAZINE-DM 6.25-15 MG/5ML PO SYRP: ORAL_SOLUTION | ORAL | 1 refills | Status: DC

## 2016-05-05 MED ORDER — PREDNISONE 20 MG PO TABS: ORAL_TABLET | ORAL | 0 refills | Status: DC

## 2016-05-05 NOTE — Patient Instructions (Signed)
Please take zpak until it is completely gone.  Please use your inhaler once daily in the morning.  Rinse your mouth out after use.  Please take prednisone with breakfast until gone.  Please use flonase 2 sprays per nostril right before bedtime.  Please take zyrtec or allegra daily to help dry up congestion.  You can take tessalon and phenergan dm as needed to help with coughing.  Please take 25-50 mg of benadryl at bedtime to help you sleep.

## 2016-05-05 NOTE — Progress Notes (Signed)
HPI  Patient presents to the office for evaluation of cough.  It has been going on for 2 weeks.  Patient reports night > day, wet, barky, worse with lying down, yellow and bloody sputum.  They also endorse change in voice, chills, fever, postnasal drip, shortness of breath, wheezing and nasal congestion, headaches, sore throat, ear itching.  .  They have tried afrin, tylenol cold and sinus, mucinex dm.  They report that nothing has worked.  They admits to other sick contacts.  Review of Systems  Constitutional: Positive for malaise/fatigue. Negative for chills and fever.  HENT: Positive for congestion, ear pain, hearing loss and sore throat.   Respiratory: Positive for cough. Negative for sputum production, shortness of breath and wheezing.   Cardiovascular: Negative for chest pain, palpitations and leg swelling.  Neurological: Positive for headaches.    PE:  Vitals:   05/05/16 0959  BP: 128/86  Pulse: 92  Resp: (!) 98  Temp: 98.2 F (36.8 C)    General:  Alert and non-toxic, WDWN, NAD HEENT: NCAT, PERLA, EOM normal, no occular discharge or erythema.  Nasal mucosal edema with sinus tenderness to palpation.  Oropharynx clear with minimal oropharyngeal edema and erythema.  Mucous membranes moist and pink. Neck:  Cervical adenopathy Chest:  RRR no MRGs.  Lungs clear to auscultation A&P with no wheezes rhonchi or rales.   Abdomen: +BS x 4 quadrants, soft, non-tender, no guarding, rigidity, or rebound. Skin: warm and dry no rash Neuro: A&Ox4, CN II-XII grossly intact  Assessment and Plan:   1. Acute URI -incruse samples given for 14 days -nasal saline -stop afrin -25-50 mg of benadryl at bedtime -daily allegra or zyrtec - predniSONE (DELTASONE) 20 MG tablet; 3 tabs po daily x 3 days, then 2 tabs x 3 days, then 1.5 tabs x 3 days, then 1 tab x 3 days, then 0.5 tabs x 3 days  Dispense: 27 tablet; Refill: 0 - promethazine-dextromethorphan (PROMETHAZINE-DM) 6.25-15 MG/5ML syrup; Take  5-10 ML PO q8hrs prn for coughing  Dispense: 180 mL; Refill: 1 - benzonatate (TESSALON PERLES) 100 MG capsule; Take 1 capsule (100 mg total) by mouth every 6 (six) hours as needed for cough.  Dispense: 30 capsule; Refill: 1 - azithromycin (ZITHROMAX Z-PAK) 250 MG tablet; 2 po day one, then 1 daily x 4 days  Dispense: 6 tablet; Refill: 0 - fluticasone (FLONASE) 50 MCG/ACT nasal spray; Place 2 sprays into both nostrils daily.  Dispense: 16 g; Refill: 0 -repeat OV if no improvement or as needed.

## 2016-05-24 ENCOUNTER — Ambulatory Visit (INDEPENDENT_AMBULATORY_CARE_PROVIDER_SITE_OTHER): Payer: 59 | Admitting: Physician Assistant

## 2016-05-24 ENCOUNTER — Encounter: Payer: Self-pay | Admitting: Physician Assistant

## 2016-05-24 DIAGNOSIS — F329 Major depressive disorder, single episode, unspecified: Secondary | ICD-10-CM

## 2016-05-24 DIAGNOSIS — F32A Depression, unspecified: Secondary | ICD-10-CM

## 2016-05-24 MED ORDER — DIVALPROEX SODIUM 250 MG PO DR TAB: 250.0000 mg | DELAYED_RELEASE_TABLET | Freq: Two times a day (BID) | ORAL | 3 refills | Status: DC

## 2016-05-24 MED ORDER — VORTIOXETINE HBR 20 MG PO TABS: 20.0000 mg | ORAL_TABLET | Freq: Every day | ORAL | 3 refills | Status: DC

## 2016-05-24 NOTE — Progress Notes (Signed)
Assessment and Plan: Depression, unspecified depression type Continue medications, doing better Call if any SI/HI or go to ER, continue counseling Denies SI/HI at this time  HPI 31 y.o.female presents for follow up for depression/anxiety.  Currently trial [facility], getting joint custody of son, braden, who is 2, day care starting Monday, and starting full time job, dad is selling her car, mainly michelle gets him at night and states she has cheated with someone else, she is staying with her mom which is a struggle  Has not done any cutting in 1 month.  Has been on zoloft, lamictal, and celexa in the past without help. She is on trintellix and depakote and states that it is helping right.   Past Medical History:  Diagnosis Date  . Depression    not on meds, doing well  . Hypertension    During pregnancy 2015  . Infection    UTI  . Kidney stones    with infections  . Migraines   . Seizures (HCC)    related to heat or not eating     Allergies  Allergen Reactions  . Hydrocodone Nausea And Vomiting  . Other Rash    Nitrile exam gloves    Current Outpatient Prescriptions on File Prior to Visit  Medication Sig  . azithromycin (ZITHROMAX Z-PAK) 250 MG tablet 2 po day one, then 1 daily x 4 days  . benzonatate (TESSALON PERLES) 100 MG capsule Take 1 capsule (100 mg total) by mouth every 6 (six) hours as needed for cough.  . divalproex (DEPAKOTE) 250 MG DR tablet Take 1 tablet (250 mg total) by mouth 2 (two) times daily.  . fluticasone (FLONASE) 50 MCG/ACT nasal spray Place 2 sprays into both nostrils daily.  . predniSONE (DELTASONE) 20 MG tablet 3 tabs po daily x 3 days, then 2 tabs x 3 days, then 1.5 tabs x 3 days, then 1 tab x 3 days, then 0.5 tabs x 3 days  . promethazine-dextromethorphan (PROMETHAZINE-DM) 6.25-15 MG/5ML syrup Take 5-10 ML PO q8hrs prn for coughing  . vortioxetine HBr (TRINTELLIX) 20 MG TABS Take 20 mg by mouth daily.   No current  facility-administered medications on file prior to visit.     ROS: all negative except above.   Physical Exam: Filed Weights   05/24/16 1628  Weight: 196 lb 12.8 oz (89.3 kg)   BP 120/84   Pulse 81   Temp 98.2 F (36.8 C)   Resp 16   Ht 5\' 9"  (1.753 m)   Wt 196 lb 12.8 oz (89.3 kg)   LMP 05/17/2016   SpO2 98%   BMI 29.06 kg/m  General Appearance: Well nourished, in no apparent distress. Eyes: PERRLA, EOMs, conjunctiva no swelling or erythema Sinuses: No Frontal/maxillary tenderness ENT/Mouth: Ext aud canals clear, TMs without erythema, bulging. No erythema, swelling, or exudate on post pharynx.  Tonsils not swollen or erythematous. Hearing normal.  Neck: Supple, thyroid normal.  Respiratory: Respiratory effort normal, BS equal bilaterally without rales, rhonchi, wheezing or stridor.  Cardio: RRR with no MRGs. Brisk peripheral pulses without edema.  Abdomen: Soft, + BS.  Non tender, no guarding, rebound, hernias, masses. Lymphatics: Non tender without lymphadenopathy.  Musculoskeletal: Full ROM, 5/5 strength, normal gait.  Skin: Warm, dry without rashes, lesions, ecchymosis.  Neuro: Cranial nerves intact. Normal muscle tone, no cerebellar symptoms. Sensation intact.  Psych: Awake and oriented X 3, normal affect, Insight and Judgment appropriate.     Quentin MullingAmanda Collier, PA-C 4:51 PM [facility]  

## 2016-06-10 ENCOUNTER — Other Ambulatory Visit: Payer: Self-pay | Admitting: Physician Assistant

## 2016-06-10 DIAGNOSIS — F32A Depression, unspecified: Secondary | ICD-10-CM

## 2016-06-10 DIAGNOSIS — F329 Major depressive disorder, single episode, unspecified: Secondary | ICD-10-CM

## 2016-06-20 ENCOUNTER — Ambulatory Visit (INDEPENDENT_AMBULATORY_CARE_PROVIDER_SITE_OTHER): Payer: 59 | Admitting: Physician Assistant

## 2016-06-20 ENCOUNTER — Encounter: Payer: Self-pay | Admitting: Physician Assistant

## 2016-06-20 VITALS — BP 120/90 | HR 79 | Temp 97.5°F | Resp 14 | Ht 69.0 in | Wt 196.0 lb

## 2016-06-20 DIAGNOSIS — N946 Dysmenorrhea, unspecified: Secondary | ICD-10-CM

## 2016-06-20 DIAGNOSIS — N6320 Unspecified lump in the left breast, unspecified quadrant: Secondary | ICD-10-CM

## 2016-06-20 DIAGNOSIS — N643 Galactorrhea not associated with childbirth: Secondary | ICD-10-CM

## 2016-06-20 DIAGNOSIS — O926 Galactorrhea: Secondary | ICD-10-CM

## 2016-06-20 LAB — CBC WITH DIFFERENTIAL/PLATELET
Basophils Absolute: 70 cells/uL (ref 0–200)
Basophils Relative: 1 %
EOS PCT: 2 %
Eosinophils Absolute: 140 cells/uL (ref 15–500)
HCT: 38.7 % (ref 35.0–45.0)
Hemoglobin: 13 g/dL (ref 11.7–15.5)
Lymphocytes Relative: 31 %
Lymphs Abs: 2170 cells/uL (ref 850–3900)
MCH: 30.4 pg (ref 27.0–33.0)
MCHC: 33.6 g/dL (ref 32.0–36.0)
MCV: 90.6 fL (ref 80.0–100.0)
MPV: 11 fL (ref 7.5–12.5)
Monocytes Absolute: 560 cells/uL (ref 200–950)
Monocytes Relative: 8 %
NEUTROS PCT: 58 %
Neutro Abs: 4060 cells/uL (ref 1500–7800)
Platelets: 234 10*3/uL (ref 140–400)
RBC: 4.27 MIL/uL (ref 3.80–5.10)
RDW: 12.5 % (ref 11.0–15.0)
WBC: 7 10*3/uL (ref 3.8–10.8)

## 2016-06-20 LAB — TSH: TSH: 1.17 m[IU]/L

## 2016-06-20 MED ORDER — LANSOPRAZOLE 15 MG PO CPDR: 15.0000 mg | DELAYED_RELEASE_CAPSULE | Freq: Every day | ORAL | 1 refills | Status: DC

## 2016-06-20 NOTE — Patient Instructions (Signed)
Food Choices for Gastroesophageal Reflux Disease, Adult When you have gastroesophageal reflux disease (GERD), the foods you eat and your eating habits are very important. Choosing the right foods can help ease your discomfort. What guidelines do I need to follow?  Choose fruits, vegetables, whole grains, and low-fat dairy products.  Choose low-fat meat, fish, and poultry.  Limit fats such as oils, salad dressings, butter, nuts, and avocado.  Keep a food diary. This helps you identify foods that cause symptoms.  Avoid foods that cause symptoms. These may be different for everyone.  Eat small meals often instead of 3 large meals a day.  Eat your meals slowly, in a place where you are relaxed.  Limit fried foods.  Cook foods using methods other than frying.  Avoid drinking alcohol.  Avoid drinking large amounts of liquids with your meals.  Avoid bending over or lying down until 2-3 hours after eating. What foods are not recommended? These are some foods and drinks that may make your symptoms worse: Vegetables  Tomatoes. Tomato juice. Tomato and spaghetti sauce. Chili peppers. Onion and garlic. Horseradish. Fruits  Oranges, grapefruit, and lemon (fruit and juice). Meats  High-fat meats, fish, and poultry. This includes hot dogs, ribs, ham, sausage, salami, and bacon. Dairy  Whole milk and chocolate milk. Sour cream. Cream. Butter. Ice cream. Cream cheese. Drinks  Coffee and tea. Bubbly (carbonated) drinks or energy drinks. Condiments  Hot sauce. Barbecue sauce. Sweets/Desserts  Chocolate and cocoa. Donuts. Peppermint and spearmint. Fats and Oils  High-fat foods. This includes French fries and potato chips. Other  Vinegar. Strong spices. This includes black pepper, white pepper, red pepper, cayenne, curry powder, cloves, ginger, and chili powder. The items listed above may not be a complete list of foods and drinks to avoid. Contact your dietitian for more information.    This information is not intended to replace advice given to you by your health care provider. Make sure you discuss any questions you have with your health care provider. Document Released: 08/15/2011 Document Revised: 07/22/2015 Document Reviewed: 12/18/2012 Elsevier Interactive Patient Education  2017 Elsevier Inc.  

## 2016-06-20 NOTE — Progress Notes (Signed)
   Subjective:    Patient ID: Sabrina Carney, female    DOB: 16-Oct-1985, 31 y.o.   MRN: [id number]  HPI 31 y.o. WF G3P1 presents with lactating bilateral breast x 2 months. Has had abnormal menses having been having heavier periods, more closely together, she is lesbian, no chance of being pregnant. Also states she felt mass on left breast over the weekend. Having GERD symptoms with vomiting x 1-2 weeks, use to be on medication, has been on aleve recently for back pain.   BMI is Body mass index is 28.94 kg/m., she is working on diet and exercise. Wt Readings from Last 3 Encounters:  06/20/16 196 lb (88.9 kg)  05/24/16 196 lb 12.8 oz (89.3 kg)  04/12/16 190 lb (86.2 kg)     Blood pressure 120/90, pulse 79, temperature 97.5 F (36.4 C), resp. rate 14, height  (1.753 m), weight 196 lb (88.9 kg), last menstrual period 06/11/2016, SpO2 99 %.  Medications Current Outpatient Prescriptions on File Prior to Visit  Medication Sig  . divalproex (DEPAKOTE) 250 MG DR tablet Take 1 tablet (250 mg total) by mouth 2 (two) times daily.  Marland Kitchen vortioxetine HBr (TRINTELLIX) 20 MG TABS Take 20 mg by mouth daily.   No current facility-administered medications on file prior to visit.     Problem list She has Kidney stones; Gestational hypertension; Right hand pain; Depression; Fatigue; Muscle ache; and Mixed bipolar I disorder (HCC) on her problem list.   Review of Systems  Constitutional: Negative.   HENT: Negative.   Respiratory: Negative.   Cardiovascular: Negative.   Gastrointestinal: Negative.        + GERD  Genitourinary: Negative.   Musculoskeletal: Negative.   Skin: Negative.  Negative for color change and rash.       Breast mass and lactation  Neurological: Negative.   Hematological: Negative.   Psychiatric/Behavioral: Negative.        Objective:   Physical Exam  Constitutional: She is oriented to person, place, and time. She appears well-developed and well-nourished.  HENT:    Head: Normocephalic and atraumatic.  Right Ear: External ear normal.  Left Ear: External ear normal.  Mouth/Throat: Oropharynx is clear and moist.  Eyes: Conjunctivae and EOM are normal. Pupils are equal, round, and reactive to light.  Neck: Normal range of motion. Neck supple. No thyromegaly present.  Cardiovascular: Normal rate, regular rhythm and normal heart sounds.  Exam reveals no gallop and no friction rub.   No murmur heard. Pulmonary/Chest: Effort normal and breath sounds normal. No respiratory distress. She has no wheezes.    Abdominal: Soft. Bowel sounds are normal. She exhibits no distension and no mass. There is no tenderness. There is no rebound and no guarding.  Musculoskeletal: Normal range of motion.  Lymphadenopathy:    She has no cervical adenopathy.  Neurological: She is alert and oriented to person, place, and time. She displays normal reflexes. No cranial nerve deficit. Coordination normal.  Skin: Skin is warm and dry.  Psychiatric: She has a normal mood and affect.       Assessment & Plan:  1. Inappropriate lactation and dysmenorrhea - Prolactin - CBC with Differential/Platelet - BASIC METABOLIC PANEL WITH GFR - Hepatic function panel - TSH - MM Digital Diagnostic Unilat L; Future

## 2016-06-21 LAB — HEPATIC FUNCTION PANEL
ALT: 11 U/L (ref 6–29)
AST: 12 U/L (ref 10–30)
Albumin: 4.1 g/dL (ref 3.6–5.1)
Alkaline Phosphatase: 54 U/L (ref 33–115)
BILIRUBIN INDIRECT: 0.2 mg/dL (ref 0.2–1.2)
Bilirubin, Direct: 0 mg/dL (ref <=0.2)
Total Bilirubin: 0.2 mg/dL (ref 0.2–1.2)
Total Protein: 6.5 g/dL (ref 6.1–8.1)

## 2016-06-21 LAB — BASIC METABOLIC PANEL WITH GFR
BUN: 15 mg/dL (ref 7–25)
CO2: 22 mmol/L (ref 20–31)
CREATININE: 0.84 mg/dL (ref 0.50–1.10)
Calcium: 9.6 mg/dL (ref 8.6–10.2)
Chloride: 106 mmol/L (ref 98–110)
GFR, Est African American: >89 mL/min (ref >=60)
Glucose, Bld: 109 mg/dL — ABNORMAL HIGH (ref 65–99)
Potassium: 4 mmol/L (ref 3.5–5.3)
SODIUM: 139 mmol/L (ref 135–146)

## 2016-06-21 LAB — PROLACTIN: PROLACTIN: 11.5 ng/mL

## 2016-06-21 NOTE — Progress Notes (Signed)
Pt aware of lab results & voiced understanding of those results.

## 2016-06-21 NOTE — Addendum Note (Signed)
Addended by: Quentin Mulling R on: 06/21/2016 01:25 PM   Modules accepted: Orders

## 2016-06-26 ENCOUNTER — Other Ambulatory Visit: Payer: Self-pay

## 2016-06-28 ENCOUNTER — Ambulatory Visit
Admission: RE | Admit: 2016-06-28 | Discharge: 2016-06-28 | Disposition: A | Discharge status: Home / Self Care | Payer: Self-pay | Source: Ambulatory Visit | Attending: Physician Assistant | Admitting: Physician Assistant

## 2016-06-28 DIAGNOSIS — N6320 Unspecified lump in the left breast, unspecified quadrant: Secondary | ICD-10-CM

## 2016-06-28 DIAGNOSIS — O926 Galactorrhea: Secondary | ICD-10-CM

## 2016-06-28 DIAGNOSIS — N643 Galactorrhea not associated with childbirth: Secondary | ICD-10-CM

## 2016-09-05 ENCOUNTER — Ambulatory Visit: Payer: Self-pay | Admitting: Physician Assistant

## 2016-09-13 ENCOUNTER — Encounter: Payer: Self-pay | Admitting: Physician Assistant

## 2016-09-13 ENCOUNTER — Ambulatory Visit (INDEPENDENT_AMBULATORY_CARE_PROVIDER_SITE_OTHER): Payer: BLUE CROSS/BLUE SHIELD | Admitting: Physician Assistant

## 2016-09-13 VITALS — BP 122/88 | HR 64 | Temp 98.2°F | Resp 18 | Ht 69.0 in | Wt 192.6 lb

## 2016-09-13 DIAGNOSIS — F329 Major depressive disorder, single episode, unspecified: Secondary | ICD-10-CM

## 2016-09-13 DIAGNOSIS — F32A Depression, unspecified: Secondary | ICD-10-CM

## 2016-09-13 DIAGNOSIS — J069 Acute upper respiratory infection, unspecified: Secondary | ICD-10-CM | POA: Diagnosis not present

## 2016-09-13 MED ORDER — AZITHROMYCIN 250 MG PO TABS: ORAL_TABLET | ORAL | 1 refills | Status: AC

## 2016-09-13 MED ORDER — PREDNISONE 20 MG PO TABS: ORAL_TABLET | ORAL | 0 refills | Status: DC

## 2016-09-13 MED ORDER — ALBUTEROL SULFATE HFA 108 (90 BASE) MCG/ACT IN AERS: 2.0000 | INHALATION_SPRAY | RESPIRATORY_TRACT | 0 refills | Status: DC | PRN

## 2016-09-13 NOTE — Patient Instructions (Addendum)
Counseling services  [facility] Hours: Monday-Thursday 830-8pm  Friday 830AM-7PM Address: [address] [phone]  [facility].  Address: [address]     [place], [place] [address] Phone-[phone]  [facility] [phone] office [email or web address] [address] [place], [place], [place] [address]   Psychological Services:  [facility] [phone]  [facility] [phone]  [facility], [place], ACCESS LINE: [phone] or [phone], [email or web address] Mobile Crisis Teams:  [facility]  Mobile Crisis Care Unit  [phone]  Assertive  Psychotherapeutic Services  [address] [place]  [phone]  [facility]  [address]  [address]  [place]  [phone]  Self-Help/Support Groups:  [facility] [place] Variety of support groups  [phone] (call for more info)   HOW TO TREAT VIRAL COUGH AND COLD SYMPTOMS:  -Symptoms usually last at least 1 week with the worst symptoms being around day 4.  - colds usually start with a sore throat and end with a cough, and the cough can take 2 weeks to get better.  -No antibiotics are needed for colds, flu, sore throats, cough, bronchitis UNLESS symptoms are longer than 7 days OR if you are getting better then get drastically worse.  -There are a lot of combination medications (Dayquil, Nyquil, Vicks 44, tyelnol cold and sinus, ETC). Please look at the ingredients on the back so that you are treating the correct symptoms and not doubling up on medications/ingredients.    Medicines you can use  Nasal congestion  - pseudoephedrine (Sudafed)- behind the counter, do not use if you have high blood pressure, medicine that have -D in them.  - phenylephrine (Sudafed PE) -Dextormethorphan +  chlorpheniramine (Coridcidin HBP)- okay if you have high blood pressure -Oxymetazoline (Afrin) nasal spray- LIMIT to 3 days -Saline nasal spray -Neti pot (used distilled or bottled water)  Ear pain/congestion  -pseudoephedrine (sudafed) - Nasonex/flonase nasal spray  Fever  -Acetaminophen (Tyelnol) -Ibuprofen (Advil, motrin, aleve)  Sore Throat  -Acetaminophen (Tyelnol) -Ibuprofen (Advil, motrin, aleve) -Drink a lot of water -Gargle with salt water - Rest your voice (don't talk) -Throat sprays -Cough drops  Body Aches  -Acetaminophen (Tyelnol) -Ibuprofen (Advil, motrin, aleve)  Headache  -Acetaminophen (Tyelnol) -Ibuprofen (Advil, motrin, aleve) - Exedrin, Exedrin Migraine  Allergy symptoms (cough, sneeze, runny nose, itchy eyes) -Claritin or loratadine cheapest but likely the weakest  -Zyrtec or certizine at night because it can make you sleepy -The strongest is allegra or fexafinadine  Cheapest at [employer], [employer], [employer]  Cough  -Dextromethorphan (Delsym)- medicine that has DM in it -Guafenesin (Mucinex/Robitussin) - cough drops - drink lots of water  Chest Congestion  -Guafenesin (Mucinex/Robitussin)  Red Itchy Eyes  - Naphcon-A  Upset Stomach  - Bland diet (nothing spicy, greasy, fried, and high acid foods like tomatoes, oranges, berries) -OKAY- cereal, bread, soup, crackers, rice -Eat smaller more frequent meals -reduce caffeine, no alcohol -Loperamide (Imodium-AD) if diarrhea -Prevacid for heart burn  General health when sick  -Hydration -wash your hands frequently -keep surfaces clean -change pillow cases and sheets often -Get fresh air but do not exercise strenuously -Vitamin D, double up on it - Vitamin C -Zinc

## 2016-09-13 NOTE — Progress Notes (Signed)
   Subjective:    Patient ID: Sabrina Carney, female    DOB: 04/23/1985, 31 y.o.   MRN: [id number]  HPI 31 y.o. WF presents with cough and cold x 1 week. Programmer, systemstates manager sick at work, + sick contacts. Having chills, sweats, cough with brown mucus and blood, SOB, hoarse, has been on tylenol and inhaler. She is not smoking at this time.   She has history of depression, possible bipolar depression, has history of cutting in the past has taken herself off depakote and trintellix. No SI/HI  Blood pressure 122/88, pulse 64, temperature 98.2 F (36.8 C), resp. rate 18, height 5\' 9"  (1.753 m), weight 192 lb 9.6 oz (87.4 kg), last menstrual period 08/24/2016, SpO2 98 %.  Medications Current Outpatient Prescriptions on File Prior to Visit  Medication Sig  . lansoprazole (PREVACID) 15 MG capsule Take 1 capsule (15 mg total) by mouth daily.   No current facility-administered medications on file prior to visit.     Problem list She has Kidney stones; Gestational hypertension; Right hand pain; Depression; Fatigue; Muscle ache; and Mixed bipolar I disorder (HCC) on her problem list.   Review of Systems  Constitutional: Negative for chills and diaphoresis.  HENT: Positive for congestion, postnasal drip, sinus pressure and sneezing. Negative for ear pain and sore throat.   Respiratory: Positive for cough, chest tightness and shortness of breath. Negative for wheezing.   Cardiovascular: Negative.   Gastrointestinal: Negative.   Genitourinary: Negative.   Musculoskeletal: Negative for neck pain.  Neurological: Positive for headaches.       Objective:   Physical Exam  Constitutional: She appears well-developed and well-nourished.  HENT:  Head: Normocephalic and atraumatic.  Right Ear: External ear normal.  Nose: Right sinus exhibits maxillary sinus tenderness. Right sinus exhibits no frontal sinus tenderness. Left sinus exhibits maxillary sinus tenderness. Left sinus exhibits no frontal sinus  tenderness.  Eyes: Conjunctivae and EOM are normal.  Neck: Normal range of motion. Neck supple.  Cardiovascular: Normal rate, regular rhythm, normal heart sounds and intact distal pulses.   Pulmonary/Chest: Effort normal and breath sounds normal. No respiratory distress. She has no wheezes.  Abdominal: Soft. Bowel sounds are normal.  Lymphadenopathy:    She has cervical adenopathy.  Skin: Skin is warm and dry.       Assessment & Plan:  1. Acute URI - predniSONE (DELTASONE) 20 MG tablet; 2 tablets daily for 3 days, 1 tablet daily for 4 days.  Dispense: 10 tablet; Refill: 0 - azithromycin (ZITHROMAX) 250 MG tablet; Take 2 tablets (500 mg) on  Day 1,  followed by 1 tablet (250 mg) once daily on Days 2 through 5.  Dispense: 6 each; Refill: 1 - albuterol (VENTOLIN HFA) 108 (90 Base) MCG/ACT inhaler; Inhale 2 puffs into the lungs every 4 (four) hours as needed for wheezing or shortness of breath.  Dispense: 1 Inhaler; Refill: 0  2. Depression Not on meds at this time, states she is doing well, encouraged to follow up psych, given numbers for counseling, no SI/HI

## 2016-10-09 ENCOUNTER — Ambulatory Visit: Payer: Self-pay | Admitting: Physician Assistant

## 2016-10-27 ENCOUNTER — Ambulatory Visit (INDEPENDENT_AMBULATORY_CARE_PROVIDER_SITE_OTHER): Payer: BLUE CROSS/BLUE SHIELD | Admitting: Family Medicine

## 2016-10-27 ENCOUNTER — Encounter: Payer: Self-pay | Admitting: Family Medicine

## 2016-10-27 VITALS — BP 115/71 | HR 84 | Temp 97.9°F | Resp 18 | Ht 69.29 in | Wt 193.4 lb

## 2016-10-27 DIAGNOSIS — L03114 Cellulitis of left upper limb: Secondary | ICD-10-CM

## 2016-10-27 DIAGNOSIS — M71122 Other infective bursitis, left elbow: Secondary | ICD-10-CM | POA: Diagnosis not present

## 2016-10-27 LAB — POCT CBC
Granulocyte percent: 71.9 %G (ref 37–80)
HCT, POC: 41.2 % (ref 37.7–47.9)
Hemoglobin: 13.7 g/dL (ref 12.2–16.2)
Lymph, poc: 2.1 (ref 0.6–3.4)
MCH, POC: 29.5 pg (ref 27–31.2)
MCHC: 33.2 g/dL (ref 31.8–35.4)
MCV: 88.7 fL (ref 80–97)
MID (cbc): 0.2 (ref 0–0.9)
MPV: 8.6 fL (ref 0–99.8)
POC Granulocyte: 5.9 (ref 2–6.9)
POC LYMPH PERCENT: 25.3 %L (ref 10–50)
POC MID %: 2.8 %M (ref 0–12)
Platelet Count, POC: 233 10*3/uL (ref 142–424)
RBC: 4.64 M/uL (ref 4.04–5.48)
RDW, POC: 12.3 %
WBC: 8.2 10*3/uL (ref 4.6–10.2)

## 2016-10-27 MED ORDER — DOXYCYCLINE MONOHYDRATE 100 MG PO CAPS: 100.0000 mg | ORAL_CAPSULE | Freq: Two times a day (BID) | ORAL | 0 refills | Status: AC

## 2016-10-27 NOTE — Progress Notes (Signed)
8/31/20181:58 PM  Sabrina Carney 05/08/1985, 31 y.o. female [id number]  Chief Complaint  Patient presents with  . Joint Swelling    X 2 days  left elbow- bug bite    HPI:   Patient is a 31 y.o. female who presents today for 2 days of increasing LEFT elbow swelling, redness and warmth, going up her forearm. She denies any trauma, thinks she might have had a bug bite. Denies fever or chills.   Depression screen PHQ 2/9 10/27/2016  Decreased Interest 1  Down, Depressed, Hopeless 1  PHQ - 2 Score 2  Altered sleeping 2  Tired, decreased energy 3  Change in appetite 2  Feeling bad or failure about yourself  2  Trouble concentrating 2  Moving slowly or fidgety/restless 3  Suicidal thoughts 0  PHQ-9 Score 16  Difficult doing work/chores Not difficult at all    Allergies  Allergen Reactions  . Hydrocodone Nausea And Vomiting  . Other Rash    Nitrile exam gloves    Current Outpatient Prescriptions on File Prior to Visit  Medication Sig Dispense Refill  . albuterol (VENTOLIN HFA) 108 (90 Base) MCG/ACT inhaler Inhale 2 puffs into the lungs every 4 (four) hours as needed for wheezing or shortness of breath. (Patient not taking: Reported on 10/27/2016) 1 Inhaler 0  . lansoprazole (PREVACID) 15 MG capsule Take 1 capsule (15 mg total) by mouth daily. (Patient not taking: Reported on 10/27/2016) 30 capsule 1  . predniSONE (DELTASONE) 20 MG tablet 2 tablets daily for 3 days, 1 tablet daily for 4 days. (Patient not taking: Reported on 10/27/2016) 10 tablet 0   No current facility-administered medications on file prior to visit.     Past Medical History:  Diagnosis Date  . Depression    not on meds, doing well  . Hypertension    During pregnancy 2015  . Infection    UTI  . Kidney stones    with infections  . Migraines   . Seizures (HCC)    related to heat or not eating    Past Surgical History:  Procedure Laterality Date  . HAND SURGERY     left; cyst removed  . WISDOM  TOOTH EXTRACTION      Social History  Substance Use Topics  . Smoking status: Light Tobacco Smoker    Types: Cigarettes    Last attempt to quit: 10/14/2011  . Smokeless tobacco: Never Used     Comment: restarted 2-3 cigs/day  . Alcohol use Yes     Comment: occ    Family History  Problem Relation Age of Onset  . Stroke Maternal Grandmother        great grandma  . Hypertension Maternal Grandmother   . Migraines Maternal Grandmother   . Heart disease Maternal Grandfather   . Diabetes Paternal Grandmother   . Depression Mother   . Migraines Mother   . Depression Father   . Mental illness Father        bipolar  . Heart disease Maternal Uncle   . Depression Sister   . Mental illness Brother        bipolar (2)  . Hearing loss Neg Hx     Review of Systems  Constitutional: Negative for chills and fever.  Musculoskeletal: Positive for joint pain.  Neurological: Negative for tingling, sensory change and focal weakness.     OBJECTIVE:  Blood pressure 115/71, pulse 84, temperature 97.9 F (36.6 C), temperature source Oral, resp. rate 18, height 5'  9.29" (1.76 m), weight 193 lb 6.4 oz (87.7 kg), last menstrual period 10/16/2016, SpO2 96 %.  Physical Exam  Constitutional: She is oriented to person, place, and time and well-developed, well-nourished, and in no distress.  HENT:  Head: Normocephalic and atraumatic.  Mouth/Throat: Oropharynx is clear and moist.  Eyes: Pupils are equal, round, and reactive to light. EOM are normal.  Pulmonary/Chest: Effort normal.  Musculoskeletal:  LEFT elbow with FROM, olecranon bursa mild swelling and tenderness, warmth and erythema which extends upto 2/3 extensor aspect of arm. No significant TTP.  Neurological: She is alert and oriented to person, place, and time.  Psychiatric: Mood and affect normal.       ASSESSMENT and PLAN:  1. Infected olecranon bursa, left Discussed diagnosis, treatment, and ssx for which to seek immediate care.  Starting doxycycyline 100mg  PO BID x 10 days. Discussed abx r/se/b. Discussed supportive care. - POCT CBC - normal  2. Cellulitis of left upper extremity See above       Myles LippsIrma M Santiago, MD [facility] [address] [place], [place] [address] Ph.  [phone] Fax [phone]

## 2016-10-27 NOTE — Patient Instructions (Addendum)
IF you received an x-ray today, you will receive an invoice from [facility]. Please contact [facility] at [phone] with questions or concerns regarding your invoice.   IF you received labwork today, you will receive an invoice from [facility]. Please contact [facility] at [phone] with questions or concerns regarding your invoice.   Our billing staff will not be able to assist you with questions regarding bills from these companies.  You will be contacted with the lab results as soon as they are available. The fastest way to get your results is to activate your My Chart account. Instructions are located on the last page of this paperwork. If you have not heard from [place] regarding the results in 2 weeks, please contact this office.     Cellulitis, Adult Cellulitis is a skin infection. The infected area is usually red and tender. This condition occurs most often in the arms and lower legs. The infection can travel to the muscles, blood, and underlying tissue and become serious. It is very important to get treated for this condition. What are the causes? Cellulitis is caused by bacteria. The bacteria enter through a break in the skin, such as a cut, burn, insect bite, open sore, or crack. What increases the risk? This condition is more likely to occur in people who:  Have a weak defense system (immune system).  Have open wounds on the skin such as cuts, burns, bites, and scrapes. Bacteria can enter the body through these open wounds.  Are older.  Have diabetes.  Have a type of long-lasting (chronic) liver disease (cirrhosis) or kidney disease.  Use IV drugs.  What are the signs or symptoms? Symptoms of this condition include:  Redness, streaking, or spotting on the skin.  Swollen area of the skin.  Tenderness or pain when an area of the skin is touched.  Warm skin.  Fever.  Chills.  Blisters.  How is this diagnosed? This condition is diagnosed  based on a medical history and physical exam. You may also have tests, including:  Blood tests.  Lab tests.  Imaging tests.  How is this treated? Treatment for this condition may include:  Medicines, such as antibiotic medicines or antihistamines.  Supportive care, such as rest and application of cold or warm cloths (cold or warm compresses) to the skin.  Hospital care, if the condition is severe.  The infection usually gets better within 1-2 days of treatment. Follow these instructions at home:  Take over-the-counter and prescription medicines only as told by your health care provider.  If you were prescribed an antibiotic medicine, take it as told by your health care provider. Do not stop taking the antibiotic even if you start to feel better.  Drink enough fluid to keep your urine clear or pale yellow.  Do not touch or rub the infected area.  Raise (elevate) the infected area above the level of your heart while you are sitting or lying down.  Apply warm or cold compresses to the affected area as told by your health care provider.  Keep all follow-up visits as told by your health care provider. This is important. These visits let your health care provider make sure a more serious infection is not developing. Contact a health care provider if:  You have a fever.  Your symptoms do not improve within 1-2 days of starting treatment.  Your bone or joint underneath the infected area becomes painful after the skin has healed.  Your infection returns  in the same area or another area.  You notice a swollen bump in the infected area.  You develop new symptoms.  You have a general ill feeling (malaise) with muscle aches and pains. Get help right away if:  Your symptoms get worse.  You feel very sleepy.  You develop vomiting or diarrhea that persists.  You notice red streaks coming from the infected area.  Your red area gets larger or turns dark in color. This  information is not intended to replace advice given to you by your health care provider. Make sure you discuss any questions you have with your health care provider. Document Released: 11/23/2004 Document Revised: 06/24/2015 Document Reviewed: 12/23/2014 Elsevier Interactive Patient Education  2017 Elsevier Inc.  Elbow Bursitis Elbow bursitis is inflammation of the fluid-filled sac (bursa) between the tip of your elbow bone (olecranon) and your skin. Elbow bursitis may also be called olecranon bursitis. Normally, the olecranon bursa has only a small amount of fluid in it to cushion and protect your elbow bone. Elbow bursitis causes fluid to build up inside the bursa. Over time, this swelling and inflammation can cause pain when you bend or lean on your elbow. What are the causes? Elbow bursitis may be caused by:  Elbow injury (acute trauma).  Leaning on hard surfaces for long periods of time.  Infection from an injury that breaks the skin near your elbow.  A bone growth (spur) that forms at the tip of your elbow.  A medical condition that causes inflammation in your body, such as gout or rheumatoid arthritis.  The cause may also be unknown. What are the signs or symptoms? The first sign of elbow bursitis is usually swelling over the tip of your elbow. This can grow to be the size of a golf ball. This may start suddenly or develop gradually. You may also have:  Pain when bending or leaning on your elbow.  Restricted movement of your elbow.  If your bursitis is caused by an infection, symptoms may also include:  Redness, warmth, and tenderness of the elbow.  Drainage of pus from the swollen area over your elbow, if the skin breaks open.  How is this diagnosed? Your health care provider may be able to diagnose elbow bursitis based on your signs and symptoms, especially if you have recently been injured. Your health care provider will also do a physical exam. This may  include:  X-rays to look for a bone spur or a bone fracture.  Draining fluid from the bursa to test it for infection.  Blood tests to rule out gout or rheumatoid arthritis.  How is this treated? Treatment for elbow bursitis depends on the cause. Treatment may include:  Medicines. These may include: ? Over-the-counter medicines to relieve pain and inflammation. ? Antibiotic medicines to fight infection. ? Injections of anti-inflammatory medicines (steroids).  Wrapping your elbow with a bandage.  Draining fluid from the bursa.  Wearing elbow pads.  If your bursitis does not get better with treatment, surgery may be needed to remove the bursa. Follow these instructions at home:  Take medicines only as directed by your health care provider.  If you were prescribed an antibiotic medicine, finish all of it even if you start to feel better.  If your bursitis is caused by an injury, rest your elbow and wear your bandage as directed by your health care provider. You may alsoapply ice to the injured area as directed by your health care provider: ? Put ice  in a plastic bag. ? Place a towel between your skin and the bag. ? Leave the ice on for 20 minutes, 2-3 times per day.  Avoid any activities that cause elbow pain.  Use elbow pads or elbow wraps to cushion your elbow. Contact a health care provider if:  You have a fever.  Your symptoms do not get better with treatment.  Your pain or swelling gets worse.  Your elbow pain or swelling goes away and then returns.  You have drainage of pus from the swollen area over your elbow. This information is not intended to replace advice given to you by your health care provider. Make sure you discuss any questions you have with your health care provider. Document Released: 03/15/2006 Document Revised: 07/22/2015 Document Reviewed: 10/22/2013 Elsevier Interactive Patient Education  [employer]2018 Elsevier Inc.

## 2016-12-01 ENCOUNTER — Encounter: Payer: Self-pay | Admitting: Family Medicine

## 2016-12-01 ENCOUNTER — Ambulatory Visit (INDEPENDENT_AMBULATORY_CARE_PROVIDER_SITE_OTHER): Payer: BLUE CROSS/BLUE SHIELD | Admitting: Family Medicine

## 2016-12-01 VITALS — BP 130/82 | HR 97 | Temp 98.0°F | Resp 16 | Ht 69.69 in | Wt 188.0 lb

## 2016-12-01 DIAGNOSIS — R112 Nausea with vomiting, unspecified: Secondary | ICD-10-CM

## 2016-12-01 DIAGNOSIS — R1013 Epigastric pain: Secondary | ICD-10-CM | POA: Diagnosis not present

## 2016-12-01 LAB — POCT URINALYSIS DIP (MANUAL ENTRY)
Bilirubin, UA: NEGATIVE
Blood, UA: NEGATIVE
Glucose, UA: NEGATIVE mg/dL
Ketones, POC UA: NEGATIVE mg/dL
Leukocytes, UA: NEGATIVE
Nitrite, UA: NEGATIVE
Protein Ur, POC: NEGATIVE mg/dL
Spec Grav, UA: 1.015 (ref 1.010–1.025)
Urobilinogen, UA: 0.2 E.U./dL
pH, UA: 6 (ref 5.0–8.0)

## 2016-12-01 LAB — POCT URINE PREGNANCY: Preg Test, Ur: NEGATIVE

## 2016-12-01 LAB — POCT GLYCOSYLATED HEMOGLOBIN (HGB A1C): Hemoglobin A1C: 5.4

## 2016-12-01 MED ORDER — LANSOPRAZOLE 15 MG PO CPDR: 15.0000 mg | DELAYED_RELEASE_CAPSULE | Freq: Two times a day (BID) | ORAL | 1 refills | Status: DC

## 2016-12-01 MED ORDER — ONDANSETRON HCL 8 MG PO TABS: 8.0000 mg | ORAL_TABLET | Freq: Three times a day (TID) | ORAL | 0 refills | Status: DC | PRN

## 2016-12-01 NOTE — Progress Notes (Signed)
10/5/201810:41 AM  Katheren Shams 1986-01-17, 31 y.o. female [id number]  Chief Complaint  Patient presents with  . Fatigue    pt states she has been feeling tired since Monday and has had some N/V   . Blurred Vision    pt states she has been having blurred vision x 3 days and has been really thirsty.     HPI:   Patient is a 31 y.o. female who presents today for about a week of not feeling well. Reports intense thirst despite having watery mouth, with epigastric pain, nausea with occassional vomiting, has not been able to eat or drink much, no diarrhea or constipation, no sick contacts. Denies any hematemesis or melena. Reports episodes of chills and diaphoresis, feeling lightheaded with blurry vision. Strong fhx of diabetes.  Depression screen [facility] 2/9 12/01/2016 10/27/2016  Decreased Interest 0 1  Down, Depressed, Hopeless 0 1  PHQ - 2 Score 0 2  Altered sleeping - 2  Tired, decreased energy - 3  Change in appetite - 2  Feeling bad or failure about yourself  - 2  Trouble concentrating - 2  Moving slowly or fidgety/restless - 3  Suicidal thoughts - 0  PHQ-9 Score - 16  Difficult doing work/chores - Not difficult at all    Allergies  Allergen Reactions  . Hydrocodone Nausea And Vomiting  . Other Rash    Nitrile exam gloves    Prior to Admission medications   Medication Sig Start Date End Date Taking? Authorizing Provider  albuterol (VENTOLIN HFA) 108 (90 Base) MCG/ACT inhaler Inhale 2 puffs into the lungs every 4 (four) hours as needed for wheezing or shortness of breath. 09/13/16  Yes Quentin Mulling, PA-C  lansoprazole (PREVACID) 15 MG capsule Take 1 capsule (15 mg total) by mouth daily. 06/20/16 06/20/17 Yes Quentin Mulling, PA-C    Past Medical History:  Diagnosis Date  . Depression    not on meds, doing well  . Hypertension    During pregnancy 2015  . Infection    UTI  . Kidney stones    with infections  . Migraines   . Seizures (HCC)    related to heat or  not eating    Past Surgical History:  Procedure Laterality Date  . HAND SURGERY     left; cyst removed  . WISDOM TOOTH EXTRACTION      Social History  Substance Use Topics  . Smoking status: Light Tobacco Smoker    Types: Cigarettes    Last attempt to quit: 10/14/2011  . Smokeless tobacco: Never Used     Comment: restarted 2-3 cigs/day  . Alcohol use Yes     Comment: occ    Family History  Problem Relation Age of Onset  . Stroke Maternal Grandmother        great grandma  . Hypertension Maternal Grandmother   . Migraines Maternal Grandmother   . Heart disease Maternal Grandfather   . Diabetes Paternal Grandmother   . Depression Mother   . Migraines Mother   . Depression Father   . Mental illness Father        bipolar  . Heart disease Maternal Uncle   . Depression Sister   . Mental illness Brother        bipolar (2)  . Hearing loss Neg Hx     Review of Systems  Constitutional: Positive for diaphoresis and malaise/fatigue. Negative for chills, fever and weight loss.  Eyes: Positive for blurred vision.  Respiratory: Negative for  cough and shortness of breath.   Cardiovascular: Positive for palpitations. Negative for chest pain and leg swelling.  Gastrointestinal: Positive for abdominal pain, heartburn, nausea and vomiting. Negative for blood in stool, constipation, diarrhea and melena.  Genitourinary: Positive for frequency. Negative for dysuria, hematuria and urgency.  Neurological: Positive for dizziness.     OBJECTIVE:  Blood pressure 130/82, pulse 97, temperature 98 F (36.7 C), temperature source Oral, resp. rate 16, height 5' 9.69" (1.77 m), weight 188 lb (85.3 kg), last menstrual period 11/24/2016, SpO2 98 %.  Physical Exam  Constitutional: She is oriented to person, place, and time and well-developed, well-nourished, and in no distress.  HENT:  Head: Normocephalic and atraumatic.  Right Ear: Hearing, tympanic membrane, external ear and ear canal normal.   Left Ear: Hearing, tympanic membrane, external ear and ear canal normal.  Mouth/Throat: Oropharynx is clear and moist.  Eyes: Pupils are equal, round, and reactive to light. EOM are normal.  Neck: Neck supple.  Cardiovascular: Normal rate, regular rhythm and normal heart sounds.  Exam reveals no gallop and no friction rub.   No murmur heard. Pulmonary/Chest: Effort normal and breath sounds normal. She has no wheezes. She has no rales.  Abdominal: Soft. Bowel sounds are normal. She exhibits no distension. There is no hepatosplenomegaly. There is tenderness in the right upper quadrant and epigastric area. There is positive Murphy's sign. There is no rebound, no guarding and no CVA tenderness.  Lymphadenopathy:    She has no cervical adenopathy.  Neurological: She is alert and oriented to person, place, and time. Gait normal.  Skin: Skin is warm and dry.    Results for orders placed or performed in visit on 12/01/16 (from the past 24 hour(s))  POCT urinalysis dipstick     Status: None   Collection Time: 12/01/16 10:35 AM  Result Value Ref Range   Color, UA yellow yellow   Clarity, UA clear clear   Glucose, UA negative negative mg/dL   Bilirubin, UA negative negative   Ketones, POC UA negative negative mg/dL   Spec Grav, UA 8.469 6.295 - 1.025   Blood, UA negative negative   pH, UA 6.0 5.0 - 8.0   Protein Ur, POC negative negative mg/dL   Urobilinogen, UA 0.2 0.2 or 1.0 E.U./dL   Nitrite, UA Negative Negative   Leukocytes, UA Negative Negative  POCT urine pregnancy     Status: None   Collection Time: 12/01/16 10:35 AM  Result Value Ref Range   Preg Test, Ur Negative Negative  POCT glycosylated hemoglobin (Hb A1C)     Status: None   Collection Time: 12/01/16 10:36 AM  Result Value Ref Range   Hemoglobin A1C 5.4       ASSESSMENT and PLAN  1. Nausea and vomiting, intractability of vomiting not specified, unspecified vomiting type HD stable today in clinic with no signs of  dehydration or acute abdomen. Unclear etiolgy, DDX : gastritis, PUD, pancreatitis, evolving GB disease. Workup per below. Empiric treatment for gastritis/PUD started. Antiemetic prn. ER precautions given.  - POCT urinalysis dipstick - normal - POCT glycosylated hemoglobin (Hb A1C) - normal - POCT urine pregnancy - negative - CBC with Differential - Comprehensive metabolic panel - Lipase - H. pylori breath test   2. Abdominal pain, epigastric - CBC with Differential - Comprehensive metabolic panel - Lipase - H. pylori breath test - Care order/instruction:  Other orders - lansoprazole (PREVACID) 15 MG capsule; Take 1 capsule (15 mg total) by mouth 2 (two)  times daily before a meal. - ondansetron (ZOFRAN) 8 MG tablet; Take 1 tablet (8 mg total) by mouth every 8 (eight) hours as needed for nausea or vomiting.  Return in about 3 days (around 12/04/2016).    Myles Lipps, MD Primary Care at [facility] [address] [place], [place] [address] Ph.  [phone] Fax [phone]

## 2016-12-01 NOTE — Patient Instructions (Signed)
     IF you received an x-ray today, you will receive an invoice from [facility]. Please contact [facility] at [phone] with questions or concerns regarding your invoice.   IF you received labwork today, you will receive an invoice from [facility]. Please contact [facility] at [phone] with questions or concerns regarding your invoice.   Our billing staff will not be able to assist you with questions regarding bills from these companies.  You will be contacted with the lab results as soon as they are available. The fastest way to get your results is to activate your My Chart account. Instructions are located on the last page of this paperwork. If you have not heard from us regarding the results in 2 weeks, please contact this office.     

## 2016-12-02 LAB — CBC WITH DIFFERENTIAL/PLATELET
Basophils Absolute: 0 10*3/uL (ref 0.0–0.2)
Basos: 1 %
EOS (ABSOLUTE): 0.1 10*3/uL (ref 0.0–0.4)
Eos: 1 %
Hematocrit: 42.2 % (ref 34.0–46.6)
Hemoglobin: 14.1 g/dL (ref 11.1–15.9)
Immature Grans (Abs): 0 10*3/uL (ref 0.0–0.1)
Immature Granulocytes: 0 %
Lymphocytes Absolute: 1.7 10*3/uL (ref 0.7–3.1)
Lymphs: 28 %
MCH: 29.6 pg (ref 26.6–33.0)
MCHC: 33.4 g/dL (ref 31.5–35.7)
MCV: 89 fL (ref 79–97)
Monocytes Absolute: 0.6 10*3/uL (ref 0.1–0.9)
Monocytes: 10 %
Neutrophils Absolute: 3.6 10*3/uL (ref 1.4–7.0)
Neutrophils: 60 %
Platelets: 246 10*3/uL (ref 150–379)
RBC: 4.77 x10E6/uL (ref 3.77–5.28)
RDW: 12.8 % (ref 12.3–15.4)
WBC: 6 10*3/uL (ref 3.4–10.8)

## 2016-12-02 LAB — COMPREHENSIVE METABOLIC PANEL
ALT: 15 IU/L (ref 0–32)
AST: 13 IU/L (ref 0–40)
Albumin/Globulin Ratio: 1.9 (ref 1.2–2.2)
Albumin: 4.9 g/dL (ref 3.5–5.5)
Alkaline Phosphatase: 78 IU/L (ref 39–117)
BUN/Creatinine Ratio: 13 (ref 9–23)
BUN: 12 mg/dL (ref 6–20)
Bilirubin Total: 0.5 mg/dL (ref 0.0–1.2)
CO2: 22 mmol/L (ref 20–29)
Calcium: 9.9 mg/dL (ref 8.7–10.2)
Chloride: 103 mmol/L (ref 96–106)
Creatinine, Ser: 0.89 mg/dL (ref 0.57–1.00)
GFR calc Af Amer: 100 mL/min/{1.73_m2} (ref >59)
GFR calc non Af Amer: 87 mL/min/{1.73_m2} (ref >59)
Globulin, Total: 2.6 g/dL (ref 1.5–4.5)
Glucose: 89 mg/dL (ref 65–99)
Potassium: 4.6 mmol/L (ref 3.5–5.2)
Sodium: 140 mmol/L (ref 134–144)
Total Protein: 7.5 g/dL (ref 6.0–8.5)

## 2016-12-02 LAB — LIPASE: Lipase: 22 U/L (ref 14–72)

## 2016-12-04 ENCOUNTER — Encounter: Payer: Self-pay | Admitting: Family Medicine

## 2016-12-04 ENCOUNTER — Ambulatory Visit (INDEPENDENT_AMBULATORY_CARE_PROVIDER_SITE_OTHER): Payer: BLUE CROSS/BLUE SHIELD | Admitting: Family Medicine

## 2016-12-04 VITALS — BP 112/78 | HR 67 | Temp 97.8°F | Resp 18 | Ht 69.45 in | Wt 191.0 lb

## 2016-12-04 DIAGNOSIS — R112 Nausea with vomiting, unspecified: Secondary | ICD-10-CM

## 2016-12-04 DIAGNOSIS — R1013 Epigastric pain: Secondary | ICD-10-CM | POA: Diagnosis not present

## 2016-12-04 NOTE — Progress Notes (Signed)
10/8/20183:05 PM  Sabrina Carney Feb 06, 1986, 31 y.o. female [id number]  Chief Complaint  Patient presents with  . Nausea    f/u- vomiting    HPI:   Patient is a 31 y.o. female who presents today for fu on nausea and vomiting. On high dose PPI, no vomiting since our visit several days ago. Nausea slightly better, still having some burning pain and reflux. States normally does that before her menses which just started yesterday, she also just started OCPs to help regulate heavy flow. She also acknowledges that current stress level do not help her GI symptoms.   Depression screen [facility] 2/9 12/04/2016 12/01/2016 10/27/2016  Decreased Interest 1 0 1  Down, Depressed, Hopeless 0 0 1  PHQ - 2 Score 1 0 2  Altered sleeping 2 - 2  Tired, decreased energy 2 - 3  Change in appetite 2 - 2  Feeling bad or failure about yourself  0 - 2  Trouble concentrating 1 - 2  Moving slowly or fidgety/restless 1 - 3  Suicidal thoughts 0 - 0  PHQ-9 Score 9 - 16  Difficult doing work/chores Somewhat difficult - Not difficult at all    Allergies  Allergen Reactions  . Hydrocodone Nausea And Vomiting  . Other Rash    Nitrile exam gloves    Prior to Admission medications   Medication Sig Start Date End Date Taking? Authorizing Provider  albuterol (VENTOLIN HFA) 108 (90 Base) MCG/ACT inhaler Inhale 2 puffs into the lungs every 4 (four) hours as needed for wheezing or shortness of breath. 09/13/16  Yes Quentin Mulling, PA-C  lansoprazole (PREVACID) 15 MG capsule Take 1 capsule (15 mg total) by mouth 2 (two) times daily before a meal. 12/01/16 12/01/17 Yes Myles Lipps, MD  ondansetron (ZOFRAN) 8 MG tablet Take 1 tablet (8 mg total) by mouth every 8 (eight) hours as needed for nausea or vomiting. 12/01/16  Yes Myles Lipps, MD    Past Medical History:  Diagnosis Date  . Depression    not on meds, doing well  . Hypertension    During pregnancy 2015  . Infection    UTI  . Kidney stones    with  infections  . Migraines   . Seizures (HCC)    related to heat or not eating    Past Surgical History:  Procedure Laterality Date  . HAND SURGERY     left; cyst removed  . WISDOM TOOTH EXTRACTION      Social History  Substance Use Topics  . Smoking status: Light Tobacco Smoker    Types: Cigarettes    Last attempt to quit: 10/14/2011  . Smokeless tobacco: Never Used     Comment: restarted 2-3 cigs/day  . Alcohol use Yes     Comment: occ    Family History  Problem Relation Age of Onset  . Stroke Maternal Grandmother        great grandma  . Hypertension Maternal Grandmother   . Migraines Maternal Grandmother   . Heart disease Maternal Grandfather   . Diabetes Paternal Grandmother   . Depression Mother   . Migraines Mother   . Depression Father   . Mental illness Father        bipolar  . Heart disease Maternal Uncle   . Depression Sister   . Mental illness Brother        bipolar (2)  . Hearing loss Neg Hx     ROS Per hpi  OBJECTIVE:  Blood  pressure 112/78, pulse 67, temperature 97.8 F (36.6 C), temperature source Oral, resp. rate 18, height 5' 9.45" (1.764 m), weight 191 lb (86.6 kg), last menstrual period 11/24/2016, SpO2 99 %.  Physical Exam  Constitutional: She is oriented to person, place, and time and well-developed, well-nourished, and in no distress.  HENT:  Head: Normocephalic and atraumatic.  Mouth/Throat: Mucous membranes are normal.  Eyes: Pupils are equal, round, and reactive to light. EOM are normal. No scleral icterus.  Neck: Neck supple.  Pulmonary/Chest: Effort normal.  Neurological: She is alert and oriented to person, place, and time. Gait normal.  Skin: Skin is warm and dry.  Psychiatric: Mood and affect normal.  Nursing note and vitals reviewed.   Recent Results (from the past 2160 hour(s))  POCT CBC     Status: None   Collection Time: 10/27/16  2:01 PM  Result Value Ref Range   WBC 8.2 4.6 - 10.2 K/uL   Lymph, poc 2.1 0.6 - 3.4    POC LYMPH PERCENT 25.3 10 - 50 %L   MID (cbc) 0.2 0 - 0.9   POC MID % 2.8 0 - 12 %M   POC Granulocyte 5.9 2 - 6.9   Granulocyte percent 71.9 37 - 80 %G   RBC 4.64 4.04 - 5.48 M/uL   Hemoglobin 13.7 12.2 - 16.2 g/dL   HCT, POC 16.1 09.6 - 47.9 %   MCV 88.7 80 - 97 fL   MCH, POC 29.5 27 - 31.2 pg   MCHC 33.2 31.8 - 35.4 g/dL   RDW, POC 04.5 %   Platelet Count, POC 233 142 - 424 K/uL   MPV 8.6 0 - 99.8 fL  POCT urinalysis dipstick     Status: None   Collection Time: 12/01/16 10:35 AM  Result Value Ref Range   Color, UA yellow yellow   Clarity, UA clear clear   Glucose, UA negative negative mg/dL   Bilirubin, UA negative negative   Ketones, POC UA negative negative mg/dL   Spec Grav, UA 4.098 1.191 - 1.025   Blood, UA negative negative   pH, UA 6.0 5.0 - 8.0   Protein Ur, POC negative negative mg/dL   Urobilinogen, UA 0.2 0.2 or 1.0 E.U./dL   Nitrite, UA Negative Negative   Leukocytes, UA Negative Negative  POCT urine pregnancy     Status: None   Collection Time: 12/01/16 10:35 AM  Result Value Ref Range   Preg Test, Ur Negative Negative  POCT glycosylated hemoglobin (Hb A1C)     Status: None   Collection Time: 12/01/16 10:36 AM  Result Value Ref Range   Hemoglobin A1C 5.4   Comprehensive metabolic panel     Status: None   Collection Time: 12/01/16 12:09 PM  Result Value Ref Range   Glucose 89 65 - 99 mg/dL   BUN 12 6 - 20 mg/dL   Creatinine, Ser 4.78 0.57 - 1.00 mg/dL   GFR calc non Af Amer 87 >59 mL/min/1.73   GFR calc Af Amer 100 >59 mL/min/1.73   BUN/Creatinine Ratio 13 9 - 23   Sodium 140 134 - 144 mmol/L   Potassium 4.6 3.5 - 5.2 mmol/L   Chloride 103 96 - 106 mmol/L   CO2 22 20 - 29 mmol/L   Calcium 9.9 8.7 - 10.2 mg/dL   Total Protein 7.5 6.0 - 8.5 g/dL   Albumin 4.9 3.5 - 5.5 g/dL   Globulin, Total 2.6 1.5 - 4.5 g/dL   Albumin/Globulin Ratio 1.9  1.2 - 2.2   Bilirubin Total 0.5 0.0 - 1.2 mg/dL   Alkaline Phosphatase 78 39 - 117 IU/L   AST 13 0 - 40 IU/L    ALT 15 0 - 32 IU/L  Lipase     Status: None   Collection Time: 12/01/16 12:09 PM  Result Value Ref Range   Lipase 22 14 - 72 U/L  CBC with Differential/Platelet     Status: None   Collection Time: 12/01/16 12:09 PM  Result Value Ref Range   WBC 6.0 3.4 - 10.8 x10E3/uL   RBC 4.77 3.77 - 5.28 x10E6/uL   Hemoglobin 14.1 11.1 - 15.9 g/dL   Hematocrit 57.8 46.9 - 46.6 %   MCV 89 79 - 97 fL   MCH 29.6 26.6 - 33.0 pg   MCHC 33.4 31.5 - 35.7 g/dL   RDW 62.9 52.8 - 41.3 %   Platelets 246 150 - 379 x10E3/uL   Neutrophils 60 Not Estab. %   Lymphs 28 Not Estab. %   Monocytes 10 Not Estab. %   Eos 1 Not Estab. %   Basos 1 Not Estab. %   Neutrophils Absolute 3.6 1.4 - 7.0 x10E3/uL   Lymphocytes Absolute 1.7 0.7 - 3.1 x10E3/uL   Monocytes Absolute 0.6 0.1 - 0.9 x10E3/uL   EOS (ABSOLUTE) 0.1 0.0 - 0.4 x10E3/uL   Basophils Absolute 0.0 0.0 - 0.2 x10E3/uL   Immature Granulocytes 0 Not Estab. %   Immature Grans (Abs) 0.0 0.0 - 0.1 x10E3/uL     ASSESSMENT and PLAN 1. Abdominal pain, epigastric 2. Nausea and vomiting, intractability of vomiting not specified, unspecified vomiting type  Somewhat improved on high dose PPI. h pylori test still pending otherwise labs normal. Cont with PPI for now, discussed GERD LFM. RTC precautions given.   Return in about 4 weeks (around 01/01/2017).    Myles Lipps, MD Primary Care at [facility] [address] [place], [place] [address] Ph.  [phone] Fax [phone]

## 2016-12-06 LAB — H. PYLORI BREATH TEST: H pylori Breath Test: NEGATIVE

## 2016-12-12 ENCOUNTER — Ambulatory Visit: Payer: Self-pay | Admitting: Physician Assistant

## 2017-01-04 ENCOUNTER — Ambulatory Visit: Payer: BLUE CROSS/BLUE SHIELD | Admitting: Family Medicine

## 2017-02-13 ENCOUNTER — Ambulatory Visit: Payer: BLUE CROSS/BLUE SHIELD | Admitting: Family Medicine

## 2017-02-13 ENCOUNTER — Other Ambulatory Visit: Payer: Self-pay

## 2017-02-13 ENCOUNTER — Encounter: Payer: Self-pay | Admitting: Family Medicine

## 2017-02-13 VITALS — BP 110/78 | HR 88 | Temp 98.2°F | Wt 203.0 lb

## 2017-02-13 DIAGNOSIS — R202 Paresthesia of skin: Secondary | ICD-10-CM | POA: Diagnosis not present

## 2017-02-13 DIAGNOSIS — M25519 Pain in unspecified shoulder: Secondary | ICD-10-CM | POA: Diagnosis not present

## 2017-02-13 DIAGNOSIS — M542 Cervicalgia: Secondary | ICD-10-CM | POA: Diagnosis not present

## 2017-02-13 DIAGNOSIS — M549 Dorsalgia, unspecified: Secondary | ICD-10-CM | POA: Diagnosis not present

## 2017-02-13 MED ORDER — TIZANIDINE HCL 4 MG PO CAPS
4.0000 mg | ORAL_CAPSULE | Freq: Three times a day (TID) | ORAL | 0 refills | Status: DC
Start: 2017-02-13 — End: 2017-02-15

## 2017-02-13 MED ORDER — SULINDAC 200 MG PO TABS: 200.0000 mg | ORAL_TABLET | Freq: Two times a day (BID) | ORAL | 0 refills | Status: DC

## 2017-02-13 MED ORDER — TRAMADOL HCL 50 MG PO TABS: 50.0000 mg | ORAL_TABLET | Freq: Three times a day (TID) | ORAL | 0 refills | Status: DC | PRN

## 2017-02-13 NOTE — Patient Instructions (Addendum)
   IF you received an x-ray today, you will receive an invoice from [facility]. Please contact [facility] at [phone] with questions or concerns regarding your invoice.   IF you received labwork today, you will receive an invoice from [facility]. Please contact [facility] at [phone] with questions or concerns regarding your invoice.   Our billing staff will not be able to assist you with questions regarding bills from these companies.  You will be contacted with the lab results as soon as they are available. The fastest way to get your results is to activate your My Chart account. Instructions are located on the last page of this paperwork. If you have not heard from us regarding the results in 2 weeks, please contact this office.      Thoracic Strain A thoracic strain, which is sometimes called a mid-back strain, is an injury to the muscles or tendons that attach to the upper part of your back behind your chest. This type of injury occurs when a muscle is overstretched or overloaded. Thoracic strains can range from mild to severe. Mild strains may involve stretching a muscle or tendon without tearing it. These injuries may heal in 1-2 weeks. More severe strains involve tearing of muscle fibers or tendons. These will cause more pain and may take 6-8 weeks to heal. What are the causes? This condition may be caused by:  An injury in which a sudden force is placed on the muscle.  Exercising without properly warming up.  Overuse of the muscle.  Improper form during certain movements.  Other injuries that surround or cause stress on the mid-back, causing a strain on the muscles.  In some cases, the cause may not be known. What increases the risk? This injury is more common in:  Athletes.  People with obesity.  What are the signs or symptoms? The main symptom of this condition is pain, especially with movement. Other symptoms  include:  Bruising.  Swelling.  Spasm.  How is this diagnosed? This condition may be diagnosed with a physical exam. X-rays may be taken to check for a fracture. How is this treated? This condition may be treated with:  Resting and icing the injured area.  Physical therapy. This will involve doing stretching and strengthening exercises.  Medicines for pain and inflammation.  Follow these instructions at home:  Rest as needed. Follow instructions from your health care provider about any restrictions on activity.  If directed, apply ice to the injured area: ? Put ice in a plastic bag. ? Place a towel between your skin and the bag. ? Leave the ice on for 20 minutes, 2-3 times per day.  Take over-the-counter and prescription medicines only as told by your health care provider.  Begin doing exercises as told by your health care provider or physical therapist.  Always warm up properly before physical activity or sports.  Bend your knees before you lift heavy objects.  Keep all follow-up visits as told by your health care provider. This is important. Contact a health care provider if:  Your pain is not helped by medicine.  Your pain, bruising, or swelling is getting worse.  You have a fever. Get help right away if:  You have shortness of breath.  You have chest pain.  You develop numbness or weakness in your legs.  You have involuntary loss of urine (urinary incontinence). This information is not intended to replace advice given to you by your health care provider. Make sure you   discuss any questions you have with your health care provider. Document Released: 05/06/2003 Document Revised: 10/16/2015 Document Reviewed: 04/09/2014 Elsevier Interactive Patient Education  2018 [employer]vier Inc.  

## 2017-02-13 NOTE — Progress Notes (Signed)
12/18/201811:41 AM  Sabrina Carney May 04, 1985, 31 y.o. female [id number]  Chief Complaint  Patient presents with  . Shoulder Injury    INJURY OCCURED AT [facility], BOX FELL ON HEAD CAUSING NECK INJURY ALONG WITH SHOULDER AND BACK PAIN. FACE TINGLE SOMETIME WHEN SHE TURNS HEAD. HAS TINGLING IN LEFT ARM    HPI:   Patient is a 31 y.o. female  who presents today for 2 weeks of left neck, shoulder and upper back pain. It started when at work a case fell from above onto her right side, hitting her on the head, making her fall onto her left knee. She states that at first she was having tingling in the left side of her face and down her left arm whenever she turned her neck to the left. That has resolved but pain continues, worse in area medial of her shoulder blade, gets really tight and feels like "cold water" running up and down her back. She has continued to work. She did not report that incident at work. She has been doing icy-hot were she can and taking ibu 600mg  without much benefit. She denies any UE weakness.   [facility] 02/13/2017 12/04/2016 12/01/2016  Decreased Interest 1 1 0  Down, Depressed, Hopeless - 0 0  PHQ - 2 Score 1 1 0  Altered sleeping - 2 -  Tired, decreased energy - 2 -  Change in appetite - 2 -  Feeling bad or failure about yourself  - 0 -  Trouble concentrating - 1 -  Moving slowly or fidgety/restless - 1 -  Suicidal thoughts - 0 -  PHQ-9 Score - 9 -  Difficult doing work/chores - Somewhat difficult -    Allergies  Allergen Reactions  . Hydrocodone Nausea And Vomiting  . Other Rash    Nitrile exam gloves    Prior to Admission medications   Medication Sig Start Date End Date Taking? Authorizing Provider  albuterol (VENTOLIN HFA) 108 (90 Base) MCG/ACT inhaler Inhale 2 puffs into the lungs every 4 (four) hours as needed for wheezing or shortness of breath. 09/13/16  Yes Quentin Mullingollier, Amanda, PA-C  lithium carbonate 150 MG capsule Take 150 mg by mouth  once.   Yes [provider]  lurasidone (LATUDA) 40 MG TABS tablet Take 40 mg by mouth Nightly.   Yes [provider]  mirtazapine (REMERON) 15 MG tablet Take 15 mg by mouth at bedtime.   Yes [provider]    Past Medical History:  Diagnosis Date  . Depression    not on meds, doing well  . Hypertension    During pregnancy 2015  . Infection    UTI  . Kidney stones    with infections  . Migraines   . Seizures (HCC)    related to heat or not eating    Past Surgical History:  Procedure Laterality Date  . HAND SURGERY     left; cyst removed  . WISDOM TOOTH EXTRACTION      Social History   Tobacco Use  . Smoking status: Light Tobacco Smoker    Types: Cigarettes    Last attempt to quit: 10/14/2011    Years since quitting: 5.3  . Smokeless tobacco: Never Used  . Tobacco comment: restarted 2-3 cigs/day  Substance Use Topics  . Alcohol use: Yes    Comment: occ    Family History  Problem Relation Age of Onset  . Stroke Maternal Grandmother        great  grandma  . Hypertension Maternal Grandmother   . Migraines Maternal Grandmother   . Heart disease Maternal Grandfather   . Diabetes Paternal Grandmother   . Depression Mother   . Migraines Mother   . Depression Father   . Mental illness Father        bipolar  . Heart disease Maternal Uncle   . Depression Sister   . Mental illness Brother        bipolar (2)  . Hearing loss Neg Hx     ROS Per hpi  OBJECTIVE:  Blood pressure 110/78, pulse 88, temperature 98.2 F (36.8 C), temperature source Oral, weight 203 lb (92.1 kg), last menstrual period 01/23/2017, SpO2 99 %.  Physical Exam  Gen: AAOx3, NAD Neck: FROM, pain with left flexion, no tenderness over spinous process, + left paraspinal and trapezius TTP, no spasms noted. + Spurling, left Shoulder; ROM, non tender Back: no tenderness over spinous process, + left paraspinal TTP and palpable spasms. Neuro: BUE strength and reflexes  intact.     ASSESSMENT and PLAN  1. Upper back pain on left side 2. Neck and shoulder pain 3. Paresthesias Discussed supportive measures, new meds r/se/b and RTC precautions. Patient educational handout given.  - tiZANidine (ZANAFLEX) 4 MG capsule; Take 1 capsule (4 mg total) by mouth 3 (three) times daily. - traMADol (ULTRAM) 50 MG tablet; Take 1 tablet (50 mg total) by mouth every 8 (eight) hours as needed. - sulindac (CLINORIL) 200 MG tablet; Take 1 tablet (200 mg total) by mouth 2 (two) times daily.  Return if symptoms worsen or fail to improve.    Myles LippsIrma M Santiago, MD [facility] [address] [place], [place] [address] Ph.  [phone] Fax [phone]

## 2017-02-15 ENCOUNTER — Other Ambulatory Visit: Payer: Self-pay

## 2017-02-15 ENCOUNTER — Emergency Department (HOSPITAL_BASED_OUTPATIENT_CLINIC_OR_DEPARTMENT_OTHER)
Admission: EM | Admit: 2017-02-15 | Discharge: 2017-02-15 | Disposition: A | Discharge status: Home / Self Care | Payer: BLUE CROSS/BLUE SHIELD | Attending: Emergency Medicine | Admitting: Emergency Medicine

## 2017-02-15 ENCOUNTER — Encounter (HOSPITAL_BASED_OUTPATIENT_CLINIC_OR_DEPARTMENT_OTHER): Payer: Self-pay | Admitting: *Deleted

## 2017-02-15 ENCOUNTER — Emergency Department (HOSPITAL_BASED_OUTPATIENT_CLINIC_OR_DEPARTMENT_OTHER): Payer: BLUE CROSS/BLUE SHIELD

## 2017-02-15 DIAGNOSIS — Z79899 Other long term (current) drug therapy: Secondary | ICD-10-CM | POA: Diagnosis not present

## 2017-02-15 DIAGNOSIS — R569 Unspecified convulsions: Secondary | ICD-10-CM | POA: Diagnosis present

## 2017-02-15 DIAGNOSIS — R55 Syncope and collapse: Secondary | ICD-10-CM | POA: Diagnosis not present

## 2017-02-15 DIAGNOSIS — F17210 Nicotine dependence, cigarettes, uncomplicated: Secondary | ICD-10-CM | POA: Insufficient documentation

## 2017-02-15 LAB — URINALYSIS, ROUTINE W REFLEX MICROSCOPIC
BILIRUBIN URINE: NEGATIVE
Glucose, UA: NEGATIVE mg/dL
HGB URINE DIPSTICK: NEGATIVE
Ketones, ur: NEGATIVE mg/dL
Leukocytes, UA: NEGATIVE
NITRITE: NEGATIVE
PROTEIN: NEGATIVE mg/dL
Specific Gravity, Urine: <1.005 — ABNORMAL LOW (ref 1.005–1.030)
pH: 6 (ref 5.0–8.0)

## 2017-02-15 LAB — CBC WITH DIFFERENTIAL/PLATELET
BASOS ABS: 0 10*3/uL (ref 0.0–0.1)
BASOS PCT: 0 %
EOS ABS: 0.1 10*3/uL (ref 0.0–0.7)
EOS PCT: 1 %
HCT: 39 % (ref 36.0–46.0)
HEMOGLOBIN: 13 g/dL (ref 12.0–15.0)
Lymphocytes Relative: 22 %
Lymphs Abs: 1.7 10*3/uL (ref 0.7–4.0)
MCH: 29.7 pg (ref 26.0–34.0)
MCHC: 33.3 g/dL (ref 30.0–36.0)
MCV: 89.2 fL (ref 78.0–100.0)
Monocytes Absolute: 0.6 10*3/uL (ref 0.1–1.0)
Monocytes Relative: 7 %
NEUTROS PCT: 70 %
Neutro Abs: 5.3 10*3/uL (ref 1.7–7.7)
PLATELETS: 210 10*3/uL (ref 150–400)
RBC: 4.37 MIL/uL (ref 3.87–5.11)
RDW: 12.4 % (ref 11.5–15.5)
WBC: 7.7 10*3/uL (ref 4.0–10.5)

## 2017-02-15 LAB — PREGNANCY, URINE: PREG TEST UR: NEGATIVE

## 2017-02-15 LAB — COMPREHENSIVE METABOLIC PANEL
ALBUMIN: 4.1 g/dL (ref 3.5–5.0)
ALT: 18 U/L (ref 14–54)
AST: 17 U/L (ref 15–41)
Alkaline Phosphatase: 71 U/L (ref 38–126)
Anion gap: 7 (ref 5–15)
BUN: 18 mg/dL (ref 6–20)
CHLORIDE: 104 mmol/L (ref 101–111)
CO2: 23 mmol/L (ref 22–32)
CREATININE: 0.94 mg/dL (ref 0.44–1.00)
Calcium: 8.7 mg/dL — ABNORMAL LOW (ref 8.9–10.3)
GFR calc non Af Amer: >60 mL/min (ref >60)
GLUCOSE: 87 mg/dL (ref 65–99)
Potassium: 3.9 mmol/L (ref 3.5–5.1)
SODIUM: 134 mmol/L — AB (ref 135–145)
Total Bilirubin: 0.5 mg/dL (ref 0.3–1.2)
Total Protein: 7.5 g/dL (ref 6.5–8.1)

## 2017-02-15 LAB — MAGNESIUM: MAGNESIUM: 2 mg/dL (ref 1.7–2.4)

## 2017-02-15 LAB — LITHIUM LEVEL: Lithium Lvl: 0.07 mmol/L — ABNORMAL LOW (ref 0.60–1.20)

## 2017-02-15 NOTE — ED Notes (Signed)
ED Provider at bedside. 

## 2017-02-15 NOTE — ED Provider Notes (Signed)
[facility] EMERGENCY DEPARTMENT Provider Note   CSN: [id number] Arrival date & time: 02/15/17  1319     History   Chief Complaint Chief Complaint  Patient presents with  . Seizures    HPI Sabrina Carney is a 31 y.o. female.  The history is provided by the patient. No language interpreter was used.  Seizures     Sabrina Carney is a 31 y.o. female who presents to the Emergency Department complaining of seizures.  He is referred from urgent care for evaluation of seizure.  Today she was at work when she began to feel unwell and nauseous.  She then blacked out.  She then went to get back up and began to feel nauseous again and sat down in the lounge and blacked out.  She has about 20 minutes that she does not recall.  She states that coworkers were checking in on her but nobody witnessed any shaking activity.  She did feel dizzy for a while.  No fever, chest pain, vomiting.  She was started on lithium December 1.  She was started on Zanaflex last night.  3 weeks ago she did have a large heavy box hit her in the head.  She did not pass out but she did have a headache at that time.  She has had similar episodes over the last several years that she describes as seizures.  During 1 of the prior episodes she did have a shaking event associated with that.  She has never been evaluated for seizures in the past. Past Medical History:  Diagnosis Date  . Depression    not on meds, doing well  . Hypertension    During pregnancy 2015  . Infection    UTI  . Kidney stones    with infections  . Migraines   . Seizures (HCC)    related to heat or not eating    Patient Active Problem List   Diagnosis Date Noted  . Right hand pain 01/12/2016  . Depression 01/12/2016  . Fatigue 01/12/2016  . Muscle ache 01/12/2016  . 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 Gestational hypertension 10/21/2013  . Kidney stones     Past Surgical History:  Procedure Laterality Date  .  HAND SURGERY     left; cyst removed  . WISDOM TOOTH EXTRACTION      OB History    Gravida Para Term Preterm AB Living   3 1 1   2 1    SAB TAB Ectopic Multiple Live Births   2       1       Home Medications    Prior to Admission medications   Medication Sig Start Date End Date Taking? Authorizing Provider  albuterol (VENTOLIN HFA) 108 (90 Base) MCG/ACT inhaler Inhale 2 puffs into the lungs every 4 (four) hours as needed for wheezing or shortness of breath. 09/13/16   Quentin Mullingollier, Amanda, PA-C  lithium carbonate 150 MG capsule Take 150 mg by mouth once.    [provider]  lurasidone (LATUDA) 40 MG TABS tablet Take 40 mg by mouth Nightly.    [provider]  mirtazapine (REMERON) 15 MG tablet Take 15 mg by mouth at bedtime.    [provider]  sulindac (CLINORIL) 200 MG tablet Take 1 tablet (200 mg total) by mouth 2 (two) times daily. 02/13/17   Myles LippsSantiago, Irma M, MD    Family History Family History  Problem Relation Age of Onset  . Stroke  Maternal Grandmother        great grandma  . Hypertension Maternal Grandmother   . Migraines Maternal Grandmother   . Heart disease Maternal Grandfather   . Diabetes Paternal Grandmother   . Depression Mother   . Migraines Mother   . Depression Father   . Mental illness Father        bipolar  . Heart disease Maternal Uncle   . Depression Sister   . Mental illness Brother        bipolar (2)  . Hearing loss Neg Hx     Social History Social History   Tobacco Use  . Smoking status: Current Some Day Smoker    Types: Cigarettes    Last attempt to quit: 10/14/2011    Years since quitting: 5.3  . Smokeless tobacco: Never Used  . Tobacco comment: restarted 2-3 cigs/day  Substance Use Topics  . Alcohol use: Yes    Comment: occ  . Drug use: No     Allergies   Hydrocodone and Other   Review of Systems Review of Systems  Neurological: Positive for seizures.  All other systems reviewed and are  negative.    Physical Exam Updated Vital Signs BP 114/83   Pulse 64   Temp (!) 97.5 F (36.4 C) (Oral)   Resp 18   Ht 5\' 10"  (1.778 m)   Wt 91.6 kg (202 lb)   LMP 01/23/2017   SpO2 100%   BMI 28.98 kg/m   Physical Exam  Constitutional: She is oriented to person, place, and time. She appears well-developed and well-nourished.  HENT:  Head: Normocephalic and atraumatic.  Cardiovascular: Normal rate and regular rhythm.  No murmur heard. Pulmonary/Chest: Effort normal and breath sounds normal. No respiratory distress.  Abdominal: Soft. There is no tenderness. There is no rebound and no guarding.  Musculoskeletal: She exhibits no edema or tenderness.  Neurological: She is alert and oriented to person, place, and time. No cranial nerve deficit or sensory deficit. Coordination normal.  Skin: Skin is warm and dry.  Psychiatric: She has a normal mood and affect. Her behavior is normal.  Nursing note and vitals reviewed.    ED Treatments / Results  Labs (all labs ordered are listed, but only abnormal results are displayed) Labs Reviewed  URINALYSIS, ROUTINE W REFLEX MICROSCOPIC - Abnormal; Notable for the following components:      Result Value   Specific Gravity, Urine <1.005 (*)    All other components within normal limits  COMPREHENSIVE METABOLIC PANEL - Abnormal; Notable for the following components:   Sodium 134 (*)    Calcium 8.7 (*)    All other components within normal limits  LITHIUM LEVEL - Abnormal; Notable for the following components:   Lithium Lvl 0.07 (*)    All other components within normal limits  PREGNANCY, URINE  CBC WITH DIFFERENTIAL/PLATELET  MAGNESIUM    EKG  EKG Interpretation  Date/Time:  Thursday February 15 2017 16:23:25 EST Ventricular Rate:  74 PR Interval:    QRS Duration: 78 QT Interval:  370 QTC Calculation: 411 R Axis:   69 Text Interpretation:  Sinus rhythm Confirmed by Tilden Fossa [phone]) on 02/15/2017 4:25:50 PM        Radiology Ct Head Wo Contrast  Result Date: 02/15/2017 CLINICAL DATA:  LOC, seizure and slurred speech EXAM: CT HEAD WITHOUT CONTRAST TECHNIQUE: Contiguous axial images were obtained from the base of the skull through the vertex without intravenous contrast. COMPARISON:  02/12/2003 FINDINGS: Brain: No  acute territorial infarction, hemorrhage, or intracranial mass is visualized. Small focal hypodensity in the left basal ganglia, likely stable compared to prior CT. This may represent old focus of ischemia versus dilated perivascular space. The ventricles are nonenlarged. Vascular: No hyperdense vessel or unexpected calcification. Skull: Normal. Negative for fracture or focal lesion. Sinuses/Orbits: No acute finding. Other: None IMPRESSION: No CT evidence for acute intracranial abnormality. Electronically Signed   By: Jasmine PangKim  Fujinaga M.D.   On: 02/15/2017 15:01    Procedures Procedures (including critical care time)  Medications Ordered in ED Medications - No data to display   Initial Impression / Assessment and Plan / ED Course  I have reviewed the triage vital signs and the nursing notes.  Pertinent labs & imaging results that were available during my care of the patient were reviewed by me and considered in my medical decision making (see chart for details).     Patient here for evaluation following 2 syncopal events described as seizures.  Based on her reports this does not sound like a definite seizure but more consistent with syncope.  She has a normal neurologic examination.  Presentation is not consistent with subarachnoid hemorrhage, PE, cardiogenic syncope.  Counseled patient on discontinuing her tizanidine and tramadol.  Discussed with neurology and PCP follow-up.  Seizure precautions discussed although seizure is unlikely.  Return precautions discussed as well.  Final Clinical Impressions(s) / ED Diagnoses   Final diagnoses:  Syncope, unspecified syncope type    ED Discharge  Orders    None       Tilden Fossaees, Jordin Dambrosio, MD 02/15/17 1635

## 2017-02-15 NOTE — ED Triage Notes (Addendum)
Pt c/o  2 seizures today with HX of same, sent here from UC for eval and an EEG and lithium level

## 2017-02-22 ENCOUNTER — Ambulatory Visit: Payer: BLUE CROSS/BLUE SHIELD | Admitting: Neurology

## 2017-02-22 ENCOUNTER — Encounter: Payer: Self-pay | Admitting: Neurology

## 2017-02-22 DIAGNOSIS — G43019 Migraine without aura, intractable, without status migrainosus: Secondary | ICD-10-CM | POA: Diagnosis not present

## 2017-02-22 DIAGNOSIS — R55 Syncope and collapse: Secondary | ICD-10-CM

## 2017-02-22 HISTORY — DX: Syncope and collapse: R55

## 2017-02-22 HISTORY — DX: Migraine without aura, intractable, without status migrainosus: G43.019

## 2017-02-22 MED ORDER — TOPIRAMATE 25 MG PO TABS: ORAL_TABLET | ORAL | 3 refills | Status: DC

## 2017-02-22 NOTE — Progress Notes (Signed)
Reason for visit: Syncope  Referring physician: [place]  Sabrina Carney is a 31 y.o. female  History of present illness:  Sabrina Carney is a 31 year old right-handed white female with a history of bipolar disorder.  The patient went to the emergency room on 15 February 2017 with 2 syncopal events that occurred at work.  The patient had been on Zanaflex for a left shoulder injury that occurred 2 weeks before, she had started the medication the night before and took the medication the morning of the syncope.  The patient claims that while at work she felt lightheaded, she felt drunk, she was slurring her speech.  The patient felt staggery.  She began to feel nauseated and decided to sit down and lean up against some shelves.  The patient had a brief blackout event at that time.  The patient got up and went to the back of the store, and laid her head down on the table but apparently blacked out again.  The patient is not sure whether or not she was blanched in the face or whether she was diaphoretic.  The patient felt ongoing nausea, she tried to vomit after the second blackout event but did not.  She also felt as if she needed to have a bowel movement.  The patient reported tingly sensations on the face around this time.  The patient indicates that the event lasted about 20 minutes.  The patient claimed that she has had multiple such events since 2005.  The patient will commonly have nausea, lightheaded sensations, and she may have a syncopal event.  She has never sought medical attention for this.  At times, the patient will be told that her face is blanched.  The patient also reports ongoing frequent headaches that may occur greater than 15 days out of the month.  The headaches are always in the right frontotemporal area.  The patient indicates that bright lights may bring on the headache, her mother and her maternal grandmother also have migraine.  The patient does not clearly relate the episodes of  nausea with the headache.  The patient may take Tylenol for the headache with some benefit.  The last such syncopal event occurred 6 weeks prior to the ER visit.  The patient denies any numbness or weakness of the extremities or any difficulty controlling the bowels or the bladder.  She denies any balance issues.  She comes to this office for an evaluation.  Lithium had apparently been started several days prior to the syncopal event.  Past Medical History:  Diagnosis Date  . Depression    not on meds, doing well  . Hypertension    During pregnancy 2015  . Infection    UTI  . Kidney stones    with infections  . Migraines   . Seizures (HCC)    related to heat or not eating    Past Surgical History:  Procedure Laterality Date  . HAND SURGERY     left; cyst removed  . WISDOM TOOTH EXTRACTION      Family History  Problem Relation Age of Onset  . Stroke Maternal Grandmother        great grandma  . Hypertension Maternal Grandmother   . Migraines Maternal Grandmother   . Heart disease Maternal Grandfather   . Diabetes Paternal Grandmother   . Depression Mother   . Migraines Mother   . Depression Father   . Mental illness Father  bipolar  . Heart disease Maternal Uncle   . Depression Sister   . Mental illness Brother        bipolar (2)  . Hearing loss Neg Hx     Social history:  reports that she has been smoking cigarettes.  she has never used smokeless tobacco. She reports that she drinks alcohol. She reports that she does not use drugs.  Medications:  Prior to Admission medications   Medication Sig Start Date End Date Taking? Authorizing Provider  albuterol (VENTOLIN HFA) 108 (90 Base) MCG/ACT inhaler Inhale 2 puffs into the lungs every 4 (four) hours as needed for wheezing or shortness of breath. 09/13/16  Yes Quentin Mullingollier, Amanda, PA-C  lithium carbonate 150 MG capsule Take 150 mg by mouth once.   Yes [provider]  lurasidone (LATUDA) 40 MG TABS tablet Take  40 mg by mouth Nightly.   Yes [provider]  mirtazapine (REMERON) 15 MG tablet Take 15 mg by mouth at bedtime.   Yes [provider]      Allergies  Allergen Reactions  . Valproic Acid     Other reaction(s): Psychosis (intolerance) With Hallucinations  . Hydrocodone Nausea And Vomiting  . Other Rash    Nitrile exam gloves    ROS:  Out of a complete 14 system review of symptoms, the patient complains only of the following symptoms, and all other reviewed systems are negative.  Weight gain Blurred vision Memory loss, confusion, headache, numbness, slurred speech, dizziness, seizure, blackout Depression, anxiety Insomnia, snoring  Blood pressure 117/77, pulse 72, height 5\' 10"  (1.778 m), weight 204 lb 8 oz (92.8 kg), last menstrual period 01/23/2017.  Physical Exam  General: The patient is alert and cooperative at the time of the examination.  Eyes: Pupils are equal, round, and reactive to light. Discs are flat bilaterally.  Neck: The neck is supple, no carotid bruits are noted.  Respiratory: The respiratory examination is clear.  Cardiovascular: The cardiovascular examination reveals a regular rate and rhythm, no obvious murmurs or rubs are noted.  Skin: Extremities are without significant edema.  Neurologic Exam  Mental status: The patient is alert and oriented x 3 at the time of the examination. The patient has apparent normal recent and remote memory, with an apparently normal attention span and concentration ability.  Cranial nerves: Facial symmetry is present. The patient has decreased pinprick sensation on the right forehead as compared to the left, symmetric on the lower face.  The patient splits the midline with vibration sensation on the forehead, decreased on the right. The strength of the facial muscles and the muscles to head turning and shoulder shrug are normal bilaterally. Speech is well enunciated, no aphasia or dysarthria is noted.  Extraocular movements are full. Visual fields are full. The tongue is midline, and the patient has symmetric elevation of the soft palate. No obvious hearing deficits are noted.  Motor: The motor testing reveals 5 over 5 strength of all 4 extremities. Good symmetric motor tone is noted throughout.  Sensory: Sensory testing is intact to pinprick, soft touch, vibration sensation, and position sense on all 4 extremities. No evidence of extinction is noted.  Coordination: Cerebellar testing reveals good finger-nose-finger and heel-to-shin bilaterally.  Gait and station: Gait is normal. Tandem gait is normal. Romberg is negative. No drift is seen.  Reflexes: Deep tendon reflexes are symmetric and normal bilaterally. Toes are downgoing bilaterally.   CT head 02/15/17:  IMPRESSION: No CT evidence for acute intracranial abnormality.  *  CT scan images were reviewed online. I agree with the written report.    Assessment/Plan:  1.  Vasovagal syncope  2.  Common migraine, intractable  The patient has had CT scan of the brain, this questions a chronic low density area in the left basal ganglia area.  The patient will be set up for MRI of the brain, she will have an EEG study.  The episodes of syncope appear to be associated with nausea, and appear to be typical for vasovagal syncope.  The patient is not having any jerking or twitching with the events.  The patient will be placed on Topamax for the headache.  She will follow-up in 3 months.  She will call for any dose adjustments of the Topamax.  Marlan Palau MD 02/22/2017 9:16 AM  [facility] [address] [place], [place] [address]  Phone [phone] Fax [phone]

## 2017-02-22 NOTE — Patient Instructions (Signed)
    We will get MRI of the brain and get an EEG.  We will start Topamax for the headache.  Topamax (topiramate) is a seizure medication that has an FDA approval for seizures and for migraine headache. Potential side effects of this medication include weight loss, cognitive slowing, tingling in the fingers and toes, and carbonated drinks will taste bad. If any significant side effects are noted on this drug, please contact our office.

## 2017-03-02 ENCOUNTER — Telehealth: Payer: Self-pay | Admitting: *Deleted

## 2017-03-02 NOTE — Telephone Encounter (Signed)
Pt spoke w/ Sabrina EpleyEmily Carney (MRI coordinator) today to try and get MRI set up.   Pt requested that when she has her EEG next week on 03/05/17, If CW,MD or nurse can speak with her about her sx.  Will send message to CW,MD as FYI.

## 2017-03-05 ENCOUNTER — Ambulatory Visit: Payer: BLUE CROSS/BLUE SHIELD

## 2017-03-05 ENCOUNTER — Telehealth: Payer: Self-pay | Admitting: Neurology

## 2017-03-05 DIAGNOSIS — R55 Syncope and collapse: Secondary | ICD-10-CM | POA: Diagnosis not present

## 2017-03-05 NOTE — Telephone Encounter (Signed)
I called the patient.  The patient is having frequent episodes of nausea, fainting.  She will lie down with these events.  She is getting tingling in the extremities associated with the Topamax.  There is a an insurance problem with getting the MRI of the brain, hopefully this will be done soon.  The EEG study done today was normal.

## 2017-03-05 NOTE — Procedures (Signed)
    History:  Sabrina Carney is a 32 year old patient with an episode that occurred on 15 February 2017 with a syncopal event that occurred at work.  The patient had felt nauseated and dizzy.  The patient lost consciousness around this time.  She had several events at work and was sent to the emergency room.  The patient is being evaluated for these episodes.  This is a routine EEG.  No skull defects are noted.  Medications include albuterol, lithium, Latuda, and Remeron.  EEG classification: Normal awake and asleep  Description of the recording: The background rhythms of this recording consists of a fairly well modulated medium amplitude background activity of 8 Hz. As the record progresses, the patient initially is in the waking state, but appears to enter the early stage II sleep during the recording, with rudimentary sleep spindles and vertex sharp wave activity seen. During the wakeful state, photic stimulation is performed, and this results in a bilateral and symmetric photic driving response. Hyperventilation was also performed, and this results in a minimal buildup of the background rhythm activities without significant slowing seen. At no time during the recording does there appear to be evidence of spike or spike wave discharges or evidence of focal slowing. EKG monitor shows no evidence of cardiac rhythm abnormalities with a heart rate of 60.  Impression: This is a normal EEG recording in the waking and sleeping state. No evidence of ictal or interictal discharges were seen at any time during the recording.

## 2017-06-07 ENCOUNTER — Telehealth: Payer: Self-pay | Admitting: Neurology

## 2017-06-07 ENCOUNTER — Encounter: Payer: Self-pay | Admitting: Adult Health

## 2017-06-07 ENCOUNTER — Ambulatory Visit: Payer: BLUE CROSS/BLUE SHIELD | Admitting: Adult Health

## 2017-06-07 VITALS — BP 117/72 | HR 73 | Ht 70.0 in | Wt 204.0 lb

## 2017-06-07 DIAGNOSIS — R55 Syncope and collapse: Secondary | ICD-10-CM

## 2017-06-07 DIAGNOSIS — G43019 Migraine without aura, intractable, without status migrainosus: Secondary | ICD-10-CM

## 2017-06-07 MED ORDER — TOPIRAMATE 25 MG PO TABS: 75.0000 mg | ORAL_TABLET | Freq: Every day | ORAL | 3 refills | Status: DC

## 2017-06-07 NOTE — Progress Notes (Signed)
I have read the note, and I agree with the clinical assessment and plan.  Sabrina Carney   
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
## 2017-06-07 NOTE — Telephone Encounter (Signed)
MR Brain wo contrast BCBS Auth: NPR Ref # [id number]. Patient is scheduled for 06/13/17 on the [facility] mobile unit.

## 2017-06-07 NOTE — Patient Instructions (Signed)
Your Plan:  Continue Topamax 75 mg at bedtime MRI brain If your symptoms worsen or you develop new symptoms please let us know.   Thank you for coming to see [facility] at [facility]. I hope we have been able to provide you high quality care today.  You may receive a patient satisfaction survey over the next few weeks. We would appreciate your feedback and comments so that we may continue to improve ourselves and the health of our patients.

## 2017-06-13 ENCOUNTER — Ambulatory Visit: Payer: BLUE CROSS/BLUE SHIELD

## 2017-06-13 DIAGNOSIS — R55 Syncope and collapse: Secondary | ICD-10-CM

## 2017-06-14 ENCOUNTER — Telehealth: Payer: Self-pay | Admitting: Neurology

## 2017-06-14 NOTE — Telephone Encounter (Signed)
MRI of the brain is unremarkable.   MRI brain 06/14/17:  IMPRESSION: This is a normal MRI of the brain without contrast.  The focus adjacent to the basal ganglia noted on the CT scan appears to be a benign expanded Virchow-Robin space.  There are no acute findings.

## 2017-09-21 ENCOUNTER — Encounter: Payer: Self-pay | Admitting: Physician Assistant

## 2017-09-21 ENCOUNTER — Other Ambulatory Visit: Payer: Self-pay

## 2017-09-21 ENCOUNTER — Ambulatory Visit: Payer: BLUE CROSS/BLUE SHIELD | Admitting: Physician Assistant

## 2017-09-21 VITALS — BP 126/86 | HR 75 | Temp 97.6°F | Resp 20 | Ht 69.69 in | Wt 205.6 lb

## 2017-09-21 DIAGNOSIS — N898 Other specified noninflammatory disorders of vagina: Secondary | ICD-10-CM

## 2017-09-21 LAB — POCT WET + KOH PREP
Trich by wet prep: ABSENT
YEAST BY KOH: ABSENT
YEAST BY WET PREP: ABSENT

## 2017-09-21 NOTE — Patient Instructions (Signed)
     IF you received an x-ray today, you will receive an invoice from [facility]. Please contact [facility] at [phone] with questions or concerns regarding your invoice.   IF you received labwork today, you will receive an invoice from [facility]. Please contact [facility] at [phone] with questions or concerns regarding your invoice.   Our billing staff will not be able to assist you with questions regarding bills from these companies.  You will be contacted with the lab results as soon as they are available. The fastest way to get your results is to activate your My Chart account. Instructions are located on the last page of this paperwork. If you have not heard from us regarding the results in 2 weeks, please contact this office.     

## 2017-09-21 NOTE — Progress Notes (Signed)
Sabrina Carney  MRN: [id number] DOB: 10-17-1985  PCP: Myles Lipps, MD  Chief Complaint  Patient presents with  . Vaginal Discharge    X 4 day  . Depression    screening done  . Anxiety    screening done    Subjective:  Pt presents to clinic for vaginal odor and discharge.  Started about 4 days ago today it is slightly better.  Last menses was a little different than normal.  Shorter but heavy for the 1st day.  She is having no urinary symptoms.  She is having fishy vaginal odor.  No vaginal itching. She is sexually active with girlfriend but they do not use toys.  Girlfriend is having no symptoms.  She has used several different soaps in the past week or so.  Patient was having irregular menses several months back had negative STD testing at that time.  She is currently with the same sexual partner and does not have concerns about monogamy.  History is obtained by patient.  Review of Systems  Genitourinary: Positive for menstrual problem (Now resolved) and vaginal discharge. Negative for vaginal bleeding.    Patient Active Problem List   Diagnosis Date Noted  . Syncope 02/22/2017  . Common migraine with intractable migraine 02/22/2017  . Right hand pain 01/12/2016  . Depression 01/12/2016  . Fatigue 01/12/2016  . Muscle ache 01/12/2016  . 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 Gestational hypertension 10/21/2013  . Kidney stones     Current Outpatient Medications on File Prior to Visit  Medication Sig Dispense Refill  . albuterol (VENTOLIN HFA) 108 (90 Base) MCG/ACT inhaler Inhale 2 puffs into the lungs every 4 (four) hours as needed for wheezing or shortness of breath. 1 Inhaler 0  . topiramate (TOPAMAX) 25 MG tablet Take 3 tablets (75 mg total) by mouth daily. (Patient not taking: Reported on 09/21/2017) 270 tablet 3   No current facility-administered medications on file prior to visit.     Allergies  Allergen Reactions  . Valproic Acid     Other  reaction(s): Psychosis (intolerance) With Hallucinations  . Hydrocodone Nausea And Vomiting  . Other Rash    Nitrile exam gloves    Past Medical History:  Diagnosis Date  . Common migraine with intractable migraine 02/22/2017  . Depression    not on meds, doing well  . Hypertension    During pregnancy 2015  . Infection    UTI  . Kidney stones    with infections  . Migraines   . Seizures (HCC)    related to heat or not eating  . Syncope 02/22/2017   Social History   Social History Narrative   Lives at home with parents.   Right-handed.   Occasional caffeine.   Social History   Tobacco Use  . Smoking status: Current Some Day Smoker    Types: Cigarettes    Last attempt to quit: 10/14/2011    Years since quitting: 5.9  . Smokeless tobacco: Never Used  . Tobacco comment: restarted 2-3 cigs/day  Substance Use Topics  . Alcohol use: Yes    Comment: occ  . Drug use: No   family history includes Depression in her father, mother, and sister; Diabetes in her paternal grandmother; Heart disease in her maternal grandfather and maternal uncle; Hypertension in her maternal grandmother; Mental illness in her brother and father; Migraines in her maternal grandmother and mother; Stroke in her maternal grandmother.     Objective:  BP  126/86 (BP Location: Left Arm, Patient Position: Sitting, Cuff Size: Normal)   Pulse 75   Temp 97.6 F (36.4 C) (Oral)   Resp 20   Ht 5' 9.69" (1.77 m)   Wt 205 lb 9.6 oz (93.3 kg)   LMP 09/12/2017 (Exact Date)   SpO2 97%   BMI 29.77 kg/m  Body mass index is 29.77 kg/m.  Wt Readings from Last 3 Encounters:  09/21/17 205 lb 9.6 oz (93.3 kg)  06/07/17 204 lb (92.5 kg)  02/22/17 204 lb 8 oz (92.8 kg)    Physical Exam  Constitutional: She is oriented to person, place, and time. She appears well-developed and well-nourished.  HENT:  Head: Normocephalic and atraumatic.  Right Ear: Hearing and external ear normal.  Left Ear: Hearing and  external ear normal.  Eyes: Conjunctivae are normal.  Neck: Normal range of motion.  Pulmonary/Chest: Effort normal.  Neurological: She is alert and oriented to person, place, and time.  Skin: Skin is warm, dry and intact.  Psychiatric: She has a normal mood and affect. Her behavior is normal. Judgment and thought content normal.  Vitals reviewed.  Results for orders placed or performed in visit on 09/21/17  POCT Wet + KOH Prep  Result Value Ref Range   Yeast by KOH Absent Absent   Yeast by wet prep Absent Absent   WBC by wet prep None (A) Few   Clue Cells Wet Prep HPF POC None None   Trich by wet prep Absent Absent   Bacteria Wet Prep HPF POC Few Few   Epithelial Cells By [employer] Financial[employer]et Pref ([facility]) Many (A) None, Few, Too numerous to count   RBC,UR,HPF,POC None None RBC/hpf    Assessment and Plan :  Vaginal discharge - Plan: POCT Wet + KOH Prep -patient wet prep shows normal vaginal flora.  Encouraged her to find a single sensitive soap to put in all showers that she uses.  Recommended no scented vaginal products.  Suspect this is related to change in soaps as well as irregular menses but body was able to physiologically return to normal so symptoms have resolved.  Discussed with patient how she can do thing additionally at home to prevent this.  Patient verbalized to me that they understand the following: diagnosis, what is being done for them, what to expect and what should be done at home.  Their questions have been answered.  See after visit summary for patient specific instructions.  Benny LennertSarah Jason Hauge PA-C  [facility] at [facility] 09/21/2017 11:56 AM  Please note: Portions of this report may have been transcribed using dragon voice recognition software. Every effort was made to ensure accuracy; however, inadvertent computerized transcription errors may be present.

## 2017-12-13 ENCOUNTER — Ambulatory Visit: Payer: BLUE CROSS/BLUE SHIELD | Admitting: Adult Health

## 2018-02-09 ENCOUNTER — Ambulatory Visit: Payer: Self-pay | Admitting: Family Medicine

## 2018-02-09 VITALS — BP 128/84 | HR 62 | Temp 98.3°F | Wt 213.8 lb

## 2018-02-09 DIAGNOSIS — R05 Cough: Secondary | ICD-10-CM

## 2018-02-09 DIAGNOSIS — R059 Cough, unspecified: Secondary | ICD-10-CM

## 2018-02-09 DIAGNOSIS — R0981 Nasal congestion: Secondary | ICD-10-CM

## 2018-02-09 DIAGNOSIS — S0031XA Abrasion of nose, initial encounter: Secondary | ICD-10-CM

## 2018-02-09 MED ORDER — CETIRIZINE HCL 10 MG PO TABS: 10.0000 mg | ORAL_TABLET | Freq: Every day | ORAL | 0 refills | Status: DC

## 2018-02-09 MED ORDER — AZELASTINE HCL 0.1 % NA SOLN: 1.0000 | Freq: Two times a day (BID) | NASAL | 0 refills | Status: DC

## 2018-02-09 MED ORDER — PROMETHAZINE-DM 6.25-15 MG/5ML PO SYRP: 5.0000 mL | ORAL_SOLUTION | Freq: Three times a day (TID) | ORAL | 0 refills | Status: DC | PRN

## 2018-02-09 MED ORDER — MUPIROCIN 2 % EX OINT: 1.0000 "application " | TOPICAL_OINTMENT | Freq: Two times a day (BID) | CUTANEOUS | 0 refills | Status: DC

## 2018-02-09 MED ORDER — BENZONATATE 200 MG PO CAPS: 200.0000 mg | ORAL_CAPSULE | Freq: Two times a day (BID) | ORAL | 0 refills | Status: AC | PRN

## 2018-02-09 NOTE — Patient Instructions (Addendum)
Cough, Adult  PLAN< Follow up with PCP to evaluate allergy component of symptoms   Coughing is a reflex that clears your throat and your airways. Coughing helps to heal and protect your lungs. It is normal to cough occasionally, but a cough that happens with other symptoms or lasts a long time may be a sign of a condition that needs treatment. A cough may last only 2-3 weeks (acute), or it may last longer than 8 weeks (chronic). What are the causes? Coughing is commonly caused by:  Breathing in substances that irritate your lungs.  A viral or bacterial respiratory infection.  Allergies.  Asthma.  Postnasal drip.  Smoking.  Acid backing up from the stomach into the esophagus (gastroesophageal reflux).  Certain medicines.  Chronic lung problems, including COPD (or rarely, lung cancer).  Other medical conditions such as heart failure.  Follow these instructions at home: Pay attention to any changes in your symptoms. Take these actions to help with your discomfort:  Take medicines only as told by your health care provider. ? If you were prescribed an antibiotic medicine, take it as told by your health care provider. Do not stop taking the antibiotic even if you start to feel better. ? Talk with your health care provider before you take a cough suppressant medicine.  Drink enough fluid to keep your urine clear or pale yellow.  If the air is dry, use a cold steam vaporizer or humidifier in your bedroom or your home to help loosen secretions.  Avoid anything that causes you to cough at work or at home.  If your cough is worse at night, try sleeping in a semi-upright position.  Avoid cigarette smoke. If you smoke, quit smoking. If you need help quitting, ask your health care provider.  Avoid caffeine.  Avoid alcohol.  Rest as needed.  Contact a health care provider if:  You have new symptoms.  You cough up pus.  Your cough does not get better after 2-3 weeks, or  your cough gets worse.  You cannot control your cough with suppressant medicines and you are losing sleep.  You develop pain that is getting worse or pain that is not controlled with pain medicines.  You have a fever.  You have unexplained weight loss.  You have night sweats. Get help right away if:  You cough up blood.  You have difficulty breathing.  Your heartbeat is very fast. This information is not intended to replace advice given to you by your health care provider. Make sure you discuss any questions you have with your health care provider. Document Released: 08/12/2010 Document Revised: 07/22/2015 Document Reviewed: 04/22/2014 Elsevier Interactive Patient Education  [employer]2018 [employer]

## 2018-02-09 NOTE — Progress Notes (Signed)
Sabrina Carney is a 32 y.o. female who presents today with concerns of cough for the last 3 weeks. She reports using over the counter Dyquil with some relief. She reports known sick contacts of son who has had a "croupy" cough. Patient reports starting new job where she is exposed to lots of chemicals that causes coughing and shortness of breath. She was prescribed an inhaler and reports that she has had to use it more frequently. She denies a diagnosis of asthma.  Review of Systems  Constitutional: Negative for chills, fever and malaise/fatigue.  HENT: Negative for congestion, ear discharge, ear pain, sinus pain and sore throat.   Eyes: Negative.   Respiratory: Negative for cough, sputum production and shortness of breath.   Cardiovascular: Negative.  Negative for chest pain.  Gastrointestinal: Negative for abdominal pain, diarrhea, nausea and vomiting.  Genitourinary: Negative for dysuria, frequency, hematuria and urgency.  Musculoskeletal: Negative for myalgias.  Skin: Negative.   Neurological: Negative for headaches.  Endo/Heme/Allergies: Negative.   Psychiatric/Behavioral: Negative.     O: Vitals:   02/09/18 1153  BP: 128/84  Pulse: 62  Temp: 98.3 F (36.8 C)  SpO2: 100%     Physical Exam Vitals signs reviewed.  Constitutional:      Appearance: She is well-developed. She is not toxic-appearing.  HENT:     Head: Normocephalic.     Right Ear: Hearing, tympanic membrane, ear canal and external ear normal.     Left Ear: Hearing, tympanic membrane, ear canal and external ear normal.     Nose: Mucosal edema, congestion and rhinorrhea present. No nasal tenderness.     Comments: Very erythremic to right nostril potential for abrasion due to frequent blowing and manipulation due to mucous drainage.    Mouth/Throat:     Pharynx: Uvula midline.  Neck:     Musculoskeletal: Normal range of motion and neck supple.  Cardiovascular:     Rate and Rhythm: Normal rate and regular  rhythm.     Pulses: Normal pulses.     Heart sounds: Normal heart sounds.  Pulmonary:     Effort: Pulmonary effort is normal.     Breath sounds: Normal breath sounds.     Comments: Infrequent dry barky cough heard on exam- otherwise WNL pulmonary exam. Abdominal:     General: Bowel sounds are normal.     Palpations: Abdomen is soft.  Musculoskeletal: Normal range of motion.  Lymphadenopathy:     Head:     Right side of head: No submental or submandibular adenopathy.     Left side of head: No submental or submandibular adenopathy.     Cervical: No cervical adenopathy.  Neurological:     Mental Status: She is alert and oriented to person, place, and time.    A: 1. Cough   2. Nasal abrasion, initial encounter   3. Nasal congestion    P: Discussed exam findings, diagnosis etiology and medication use and indications reviewed with patient. Follow- Up and discharge instructions provided. No emergent/urgent issues found on exam.  Patient verbalized understanding of information provided and agrees with plan of care (POC), all questions answered.  PLAN< Follow up with PCP to evaluate allergy component of symptoms- will address acute cough and potential allergy component but if symptoms persist PCP should provide comprehensive evaluation.  1. Cough - benzonatate (TESSALON) 200 MG capsule; Take 1 capsule (200 mg total) by mouth 2 (two) times daily as needed for cough. - promethazine-dextromethorphan (PROMETHAZINE-DM) 6.25-15 MG/5ML syrup; Take 5  mLs by mouth 3 (three) times daily as needed for cough (may cause drowsiness). - cetirizine (ZYRTEC) 10 MG tablet; Take 1 tablet (10 mg total) by mouth daily. - azelastine (ASTELIN) 0.1 % nasal spray; Place 1 spray into both nostrils 2 (two) times daily. Use in each nostril as directed  2. Nasal abrasion, initial encounter - mupirocin ointment (BACTROBAN) 2 %; Place 1 application into the nose 2 (two) times daily.  3. Nasal congestion -  cetirizine (ZYRTEC) 10 MG tablet; Take 1 tablet (10 mg total) by mouth daily. - azelastine (ASTELIN) 0.1 % nasal spray; Place 1 spray into both nostrils 2 (two) times daily. Use in each nostril as directed

## 2018-04-08 DIAGNOSIS — N946 Dysmenorrhea, unspecified: Secondary | ICD-10-CM | POA: Diagnosis not present

## 2018-04-23 DIAGNOSIS — N924 Excessive bleeding in the premenopausal period: Secondary | ICD-10-CM | POA: Diagnosis not present

## 2018-04-23 DIAGNOSIS — N946 Dysmenorrhea, unspecified: Secondary | ICD-10-CM | POA: Diagnosis not present
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
## 2018-05-01 ENCOUNTER — Ambulatory Visit: Payer: 59 | Admitting: Family Medicine

## 2018-05-01 ENCOUNTER — Encounter: Payer: Self-pay | Admitting: Family Medicine

## 2018-05-01 VITALS — BP 119/82 | HR 67 | Ht 69.0 in | Wt 215.8 lb

## 2018-05-01 DIAGNOSIS — G43709 Chronic migraine without aura, not intractable, without status migrainosus: Secondary | ICD-10-CM | POA: Diagnosis not present

## 2018-05-01 MED ORDER — TOPIRAMATE 25 MG PO TABS: 75.0000 mg | ORAL_TABLET | Freq: Every day | ORAL | 3 refills | Status: DC

## 2018-05-01 NOTE — Progress Notes (Signed)
I have read the note, and I agree with the clinical assessment and plan.  Adalaya Irion K Katherine Syme   

## 2018-05-01 NOTE — Patient Instructions (Signed)
Start Topamax 25mg  for 1 week, then 50mg  for 1 week, then 75 mg daily  Topiramate tablets What is this medicine? TOPIRAMATE (toe PYRE a mate) is used to treat seizures in adults or children with epilepsy. It is also used for the prevention of migraine headaches. This medicine may be used for other purposes; ask your health care provider or pharmacist if you have questions. COMMON BRAND NAME(S): Topamax, Topiragen What should I tell my health care provider before I take this medicine? They need to know if you have any of these conditions: -bleeding disorders -cirrhosis of the liver or liver disease -diarrhea -glaucoma -kidney stones or kidney disease -low blood counts, like low white cell, platelet, or red cell counts -lung disease like asthma, obstructive pulmonary disease, emphysema -metabolic acidosis -on a ketogenic diet -schedule for surgery or a procedure -suicidal thoughts, plans, or attempt; a previous suicide attempt by you or a family member -an unusual or allergic reaction to topiramate, other medicines, foods, dyes, or preservatives -pregnant or trying to get pregnant -breast-feeding How should I use this medicine? Take this medicine by mouth with a glass of water. Follow the directions on the prescription label. Do not crush or chew. You may take this medicine with meals. Take your medicine at regular intervals. Do not take it more often than directed. Talk to your pediatrician regarding the use of this medicine in children. Special care may be needed. While this drug may be prescribed for children as young as 55 years of age for selected conditions, precautions do apply. Overdosage: If you think you have taken too much of this medicine contact a poison control center or emergency room at once. NOTE: This medicine is only for you. Do not share this medicine with others. What if I miss a dose? If you miss a dose, take it as soon as you can. If your next dose is to be taken in  less than 6 hours, then do not take the missed dose. Take the next dose at your regular time. Do not take double or extra doses. What may interact with this medicine? Do not take this medicine with any of the following medications: -probenecid This medicine may also interact with the following medications: -acetazolamide -alcohol -amitriptyline -aspirin and aspirin-like medicines -birth control pills -certain medicines for depression -certain medicines for seizures -certain medicines that treat or prevent blood clots like warfarin, enoxaparin, dalteparin, apixaban, dabigatran, and rivaroxaban -digoxin -hydrochlorothiazide -lithium -medicines for pain, sleep, or muscle relaxation -metformin -methazolamide -NSAIDS, medicines for pain and inflammation, like ibuprofen or naproxen -pioglitazone -risperidone This list may not describe all possible interactions. Give your health care provider a list of all the medicines, herbs, non-prescription drugs, or dietary supplements you use. Also tell them if you smoke, drink alcohol, or use illegal drugs. Some items may interact with your medicine. What should I watch for while using this medicine? Visit your doctor or health care professional for regular checks on your progress. Do not stop taking this medicine suddenly. This increases the risk of seizures if you are using this medicine to control epilepsy. Wear a medical identification bracelet or chain to say you have epilepsy or seizures, and carry a card that lists all your medicines. This medicine can decrease sweating and increase your body temperature. Watch for signs of deceased sweating or fever, especially in children. Avoid extreme heat, hot baths, and saunas. Be careful about exercising, especially in hot weather. Contact your health care provider right away if you notice  a fever or decrease in sweating. You should drink plenty of fluids while taking this medicine. If you have had kidney  stones in the past, this will help to reduce your chances of forming kidney stones. If you have stomach pain, with nausea or vomiting and yellowing of your eyes or skin, call your doctor immediately. You may get drowsy, dizzy, or have blurred vision. Do not drive, use machinery, or do anything that needs mental alertness until you know how this medicine affects you. To reduce dizziness, do not sit or stand up quickly, especially if you are an older patient. Alcohol can increase drowsiness and dizziness. Avoid alcoholic drinks. If you notice blurred vision, eye pain, or other eye problems, seek medical attention at once for an eye exam. The use of this medicine may increase the chance of suicidal thoughts or actions. Pay special attention to how you are responding while on this medicine. Any worsening of mood, or thoughts of suicide or dying should be reported to your health care professional right away. This medicine may increase the chance of developing metabolic acidosis. If left untreated, this can cause kidney stones, bone disease, or slowed growth in children. Symptoms include breathing fast, fatigue, loss of appetite, irregular heartbeat, or loss of consciousness. Call your doctor immediately if you experience any of these side effects. Also, tell your doctor about any surgery you plan on having while taking this medicine since this may increase your risk for metabolic acidosis. Birth control pills may not work properly while you are taking this medicine. Talk to your doctor about using an extra method of birth control. Women who become pregnant while using this medicine may enroll in the [place] American Antiepileptic Drug Pregnancy Registry by calling [phone]. This registry collects information about the safety of antiepileptic drug use during pregnancy. What side effects may I notice from receiving this medicine? Side effects that you should report to your doctor or health care professional as  soon as possible: -allergic reactions like skin rash, itching or hives, swelling of the face, lips, or tongue -decreased sweating and/or rise in body temperature -depression -difficulty breathing, fast or irregular breathing patterns -difficulty speaking -difficulty walking or controlling muscle movements -hearing impairment -redness, blistering, peeling or loosening of the skin, including inside the mouth -tingling, pain or numbness in the hands or feet -unusual bleeding or bruising -unusually weak or tired -worsening of mood, thoughts or actions of suicide or dying Side effects that usually do not require medical attention (report to your doctor or health care professional if they continue or are bothersome): -altered taste -back pain, joint or muscle aches and pains -diarrhea, or constipation -headache -loss of appetite -nausea -stomach upset, indigestion -tremors This list may not describe all possible side effects. Call your doctor for medical advice about side effects. You may report side effects to FDA at 1-800-FDA-1088. Where should I keep my medicine? Keep out of the reach of children. Store at room temperature between 15 and 30 degrees C (59 and 86 degrees F) in a tightly closed container. Protect from moisture. Throw away any unused medicine after the expiration date. NOTE: This sheet is a summary. It may not cover all possible information. If you have questions about this medicine, talk to your doctor, pharmacist, or health care provider.  2019 Elsevier/Gold Standard (2013-02-17 23:17:57) Migraine Headache  A migraine headache is a very strong throbbing pain on one side or both sides of your head. Migraines can also cause other symptoms. Talk with  your doctor about what things may bring on (trigger) your migraine headaches. Follow these instructions at home: Medicines  Take over-the-counter and prescription medicines only as told by your doctor.  Do not drive or use  heavy machinery while taking prescription pain medicine.  To prevent or treat constipation while you are taking prescription pain medicine, your doctor may recommend that you: ? Drink enough fluid to keep your pee (urine) clear or pale yellow. ? Take over-the-counter or prescription medicines. ? Eat foods that are high in fiber. These include fresh fruits and vegetables, whole grains, and beans. ? Limit foods that are high in fat and processed sugars. These include fried and sweet foods. Lifestyle  Avoid alcohol.  Do not use any products that contain nicotine or tobacco, such as cigarettes and e-cigarettes. If you need help quitting, ask your doctor.  Get at least 8 hours of sleep every night.  Limit your stress. General instructions   Keep a journal to find out what may bring on your migraines. For example, write down: ? What you eat and drink. ? How much sleep you get. ? Any change in what you eat or drink. ? Any change in your medicines.  If you have a migraine: ? Avoid things that make your symptoms worse, such as bright lights. ? It may help to lie down in a dark, quiet room. ? Do not drive or use heavy machinery. ? Ask your doctor what activities are safe for you.  Keep all follow-up visits as told by your doctor. This is important. Contact a doctor if:  You get a migraine that is different or worse than your usual migraines. Get help right away if:  Your migraine gets very bad.  You have a fever.  You have a stiff neck.  You have trouble seeing.  Your muscles feel weak or like you cannot control them.  You start to lose your balance a lot.  You start to have trouble walking.  You pass out (faint). This information is not intended to replace advice given to you by your health care provider. Make sure you discuss any questions you have with your health care provider. Document Released: 11/23/2007 Document Revised: 11/07/2017 Document Reviewed:  08/02/2015 Elsevier Interactive Patient Education  2019 [employer].

## 2018-06-27 DIAGNOSIS — R102 Pelvic and perineal pain: Secondary | ICD-10-CM | POA: Diagnosis not present

## 2018-06-27 DIAGNOSIS — Z01419 Encounter for gynecological examination (general) (routine) without abnormal findings: Secondary | ICD-10-CM | POA: Diagnosis not present

## 2018-06-27 DIAGNOSIS — Z6830 Body mass index (BMI) 30.0-30.9, adult: Secondary | ICD-10-CM | POA: Diagnosis not present

## 2018-09-03 ENCOUNTER — Other Ambulatory Visit (HOSPITAL_COMMUNITY)
Admission: RE | Admit: 2018-09-03 | Discharge: 2018-09-03 | Disposition: A | Discharge status: Home / Self Care | Payer: 59 | Source: Ambulatory Visit | Attending: Obstetrics and Gynecology | Admitting: Obstetrics and Gynecology

## 2018-09-03 DIAGNOSIS — Z1159 Encounter for screening for other viral diseases: Secondary | ICD-10-CM | POA: Insufficient documentation

## 2018-09-03 DIAGNOSIS — Z01812 Encounter for preprocedural laboratory examination: Secondary | ICD-10-CM | POA: Insufficient documentation

## 2018-09-03 LAB — SARS CORONAVIRUS 2 (TAT 6-24 HRS): SARS Coronavirus 2: NEGATIVE

## 2018-09-03 NOTE — H&P (Addendum)
Sabrina Carney is an 33 y.o. female presents for diagnostic laparoscopy.  She has chronic pelvic pain - no improvement with OCPs and progestin only OCPs.    Pertinent Gynecological History:   Menstrual History: No LMP recorded.    Past Medical History:  Diagnosis Date  . Common migraine with intractable migraine 02/22/2017  . Depression    not on meds, doing well  . Hypertension    During pregnancy 2015  . Infection    UTI  . Kidney stones    with infections  . Migraines   . Seizures ([place])    related to heat or not eating  . Syncope 02/22/2017    Past Surgical History:  Procedure Laterality Date  . HAND SURGERY     left; cyst removed  . WISDOM TOOTH EXTRACTION      Family History  Problem Relation Age of Onset  . Stroke Maternal Grandmother        great grandma  . Hypertension Maternal Grandmother   . Migraines Maternal Grandmother   . Heart disease Maternal Grandfather   . Diabetes Paternal Grandmother   . Depression Mother   . Migraines Mother   . Depression Father   . Mental illness Father        bipolar  . Heart disease Maternal Uncle   . Depression Sister   . Mental illness Brother        bipolar (2)  . Hearing loss Neg Hx     Social History:  reports that she has been smoking cigarettes. She has never used smokeless tobacco. She reports current alcohol use. She reports that she does not use drugs.  Allergies:  Allergies  Allergen Reactions  . Valproic Acid     Other reaction(s): Psychosis (intolerance) With Hallucinations  . Hydrocodone Nausea And Vomiting  . Other Rash    Nitrile exam gloves    No medications prior to admission.    ROS  AF, VSS Physical Exam  Gen - NAD Abd - soft, NT/ND Ext - NT, no edema PV - tender with evaluation. No palpable masses  [place]:  Normal uterus.  No adnexal mass or free fluid  Assessment/Plan: Chronic pelvic pain - diagnostic laparoscopy R/b/a discussed, questions answered, informed  consent Marylynn Pearson 09/03/2018, 12:03 PM

## 2018-09-04 ENCOUNTER — Encounter (HOSPITAL_BASED_OUTPATIENT_CLINIC_OR_DEPARTMENT_OTHER): Payer: Self-pay | Admitting: *Deleted

## 2018-09-04 ENCOUNTER — Other Ambulatory Visit: Payer: Self-pay

## 2018-09-04 NOTE — Progress Notes (Signed)
Spoke with patient via telephone for pre op interview. No medications AM of surgery. Covid screening completed 7/7. Educated patient that she could only have one person with her and visitor and patient had to wear a mask. NPO after MN. Arrival time 0530.

## 2018-09-06 ENCOUNTER — Encounter (HOSPITAL_BASED_OUTPATIENT_CLINIC_OR_DEPARTMENT_OTHER)
Admission: RE | Disposition: A | Discharge status: Home / Self Care | Payer: Self-pay | Source: Home / Self Care | Attending: Obstetrics and Gynecology

## 2018-09-06 ENCOUNTER — Ambulatory Visit (HOSPITAL_BASED_OUTPATIENT_CLINIC_OR_DEPARTMENT_OTHER): Payer: 59 | Admitting: Anesthesiology

## 2018-09-06 ENCOUNTER — Other Ambulatory Visit: Payer: Self-pay

## 2018-09-06 ENCOUNTER — Encounter (HOSPITAL_BASED_OUTPATIENT_CLINIC_OR_DEPARTMENT_OTHER): Payer: Self-pay | Admitting: Anesthesiology

## 2018-09-06 ENCOUNTER — Ambulatory Visit (HOSPITAL_BASED_OUTPATIENT_CLINIC_OR_DEPARTMENT_OTHER)
Admission: RE | Admit: 2018-09-06 | Discharge: 2018-09-06 | Disposition: A | Discharge status: Home / Self Care | Payer: 59 | Attending: Obstetrics and Gynecology | Admitting: Obstetrics and Gynecology

## 2018-09-06 DIAGNOSIS — N946 Dysmenorrhea, unspecified: Secondary | ICD-10-CM | POA: Diagnosis not present

## 2018-09-06 DIAGNOSIS — Z885 Allergy status to narcotic agent status: Secondary | ICD-10-CM | POA: Insufficient documentation

## 2018-09-06 DIAGNOSIS — F319 Bipolar disorder, unspecified: Secondary | ICD-10-CM | POA: Insufficient documentation

## 2018-09-06 DIAGNOSIS — G43909 Migraine, unspecified, not intractable, without status migrainosus: Secondary | ICD-10-CM | POA: Diagnosis not present

## 2018-09-06 DIAGNOSIS — R569 Unspecified convulsions: Secondary | ICD-10-CM | POA: Insufficient documentation

## 2018-09-06 DIAGNOSIS — F17210 Nicotine dependence, cigarettes, uncomplicated: Secondary | ICD-10-CM | POA: Insufficient documentation

## 2018-09-06 DIAGNOSIS — Z87442 Personal history of urinary calculi: Secondary | ICD-10-CM | POA: Insufficient documentation

## 2018-09-06 DIAGNOSIS — R102 Pelvic and perineal pain: Secondary | ICD-10-CM | POA: Diagnosis present

## 2018-09-06 DIAGNOSIS — Z888 Allergy status to other drugs, medicaments and biological substances status: Secondary | ICD-10-CM | POA: Insufficient documentation

## 2018-09-06 DIAGNOSIS — G8929 Other chronic pain: Secondary | ICD-10-CM | POA: Insufficient documentation

## 2018-09-06 HISTORY — PX: LAPAROSCOPY: SHX197

## 2018-09-06 LAB — CBC
HCT: 41.3 % (ref 36.0–46.0)
Hemoglobin: 13.4 g/dL (ref 12.0–15.0)
MCH: 30 pg (ref 26.0–34.0)
MCHC: 32.4 g/dL (ref 30.0–36.0)
MCV: 92.6 fL (ref 80.0–100.0)
Platelets: 212 10*3/uL (ref 150–400)
RBC: 4.46 MIL/uL (ref 3.87–5.11)
RDW: 12.5 % (ref 11.5–15.5)
WBC: 6.2 10*3/uL (ref 4.0–10.5)
nRBC: 0 % (ref 0.0–0.2)

## 2018-09-06 LAB — ABO/RH: ABO/RH(D): A POS

## 2018-09-06 LAB — TYPE AND SCREEN
ABO/RH(D): A POS
Antibody Screen: NEGATIVE

## 2018-09-06 LAB — POCT PREGNANCY, URINE: Preg Test, Ur: NEGATIVE

## 2018-09-06 SURGERY — LAPAROSCOPY, DIAGNOSTIC
Anesthesia: General | Site: Abdomen

## 2018-09-06 MED ORDER — OXYCODONE HCL 5 MG PO TABS
5.0000 mg | ORAL_TABLET | Freq: Once | ORAL | Status: DC | PRN
  Filled 2018-09-06: qty 1

## 2018-09-06 MED ORDER — FENTANYL CITRATE (PF) 100 MCG/2ML IJ SOLN
INTRAMUSCULAR | Status: AC
  Filled 2018-09-06: qty 2

## 2018-09-06 MED ORDER — OXYCODONE-ACETAMINOPHEN 5-325 MG PO TABS: 2.0000 | ORAL_TABLET | ORAL | 0 refills | Status: AC | PRN

## 2018-09-06 MED ORDER — FENTANYL CITRATE (PF) 100 MCG/2ML IJ SOLN
INTRAMUSCULAR | Status: DC | PRN
  Administered 2018-09-06 (×3): 25 ug via INTRAVENOUS
  Administered 2018-09-06: 100 ug via INTRAVENOUS
  Administered 2018-09-06: 25 ug via INTRAVENOUS

## 2018-09-06 MED ORDER — SCOPOLAMINE 1 MG/3DAYS TD PT72
1.0000 | MEDICATED_PATCH | TRANSDERMAL | Status: DC
  Administered 2018-09-06: 1.5 mg via TRANSDERMAL
  Filled 2018-09-06: qty 1

## 2018-09-06 MED ORDER — ROCURONIUM BROMIDE 100 MG/10ML IV SOLN
INTRAVENOUS | Status: DC | PRN
  Administered 2018-09-06: 50 mg via INTRAVENOUS

## 2018-09-06 MED ORDER — MIDAZOLAM HCL 2 MG/2ML IJ SOLN
INTRAMUSCULAR | Status: AC
  Filled 2018-09-06: qty 2

## 2018-09-06 MED ORDER — ONDANSETRON HCL 4 MG/2ML IJ SOLN
4.0000 mg | Freq: Once | INTRAMUSCULAR | Status: DC | PRN
  Filled 2018-09-06: qty 2

## 2018-09-06 MED ORDER — ONDANSETRON HCL 4 MG/2ML IJ SOLN
INTRAMUSCULAR | Status: DC | PRN
  Administered 2018-09-06: 4 mg via INTRAVENOUS

## 2018-09-06 MED ORDER — DEXAMETHASONE SODIUM PHOSPHATE 4 MG/ML IJ SOLN
INTRAMUSCULAR | Status: DC | PRN
  Administered 2018-09-06: 10 mg via INTRAVENOUS

## 2018-09-06 MED ORDER — LIDOCAINE HCL (CARDIAC) PF 100 MG/5ML IV SOSY
PREFILLED_SYRINGE | INTRAVENOUS | Status: DC | PRN
  Administered 2018-09-06: 60 mg via INTRAVENOUS

## 2018-09-06 MED ORDER — SODIUM CHLORIDE 0.9 % IV SOLN
INTRAVENOUS | Status: AC
  Filled 2018-09-06: qty 2

## 2018-09-06 MED ORDER — LACTATED RINGERS IV SOLN
INTRAVENOUS | Status: DC
  Administered 2018-09-06 (×2): via INTRAVENOUS
  Filled 2018-09-06: qty 1000

## 2018-09-06 MED ORDER — LACTATED RINGERS IV SOLN
INTRAVENOUS | Status: DC
  Administered 2018-09-06: 06:00:00 via INTRAVENOUS
  Filled 2018-09-06: qty 1000

## 2018-09-06 MED ORDER — MIDAZOLAM HCL 5 MG/5ML IJ SOLN
INTRAMUSCULAR | Status: DC | PRN
  Administered 2018-09-06: 2 mg via INTRAVENOUS

## 2018-09-06 MED ORDER — OXYCODONE HCL 5 MG/5ML PO SOLN
5.0000 mg | Freq: Once | ORAL | Status: DC | PRN
  Filled 2018-09-06: qty 5

## 2018-09-06 MED ORDER — LIDOCAINE 2% (20 MG/ML) 5 ML SYRINGE
INTRAMUSCULAR | Status: AC
  Filled 2018-09-06: qty 5

## 2018-09-06 MED ORDER — SCOPOLAMINE 1 MG/3DAYS TD PT72
MEDICATED_PATCH | TRANSDERMAL | Status: AC
  Filled 2018-09-06: qty 1

## 2018-09-06 MED ORDER — BUPIVACAINE HCL 0.25 % IJ SOLN
INTRAMUSCULAR | Status: DC | PRN
  Administered 2018-09-06: 6 mL

## 2018-09-06 MED ORDER — FENTANYL CITRATE (PF) 100 MCG/2ML IJ SOLN
25.0000 ug | INTRAMUSCULAR | Status: DC | PRN
  Filled 2018-09-06: qty 1

## 2018-09-06 MED ORDER — PROPOFOL 10 MG/ML IV BOLUS
INTRAVENOUS | Status: DC | PRN
  Administered 2018-09-06: 200 mg via INTRAVENOUS

## 2018-09-06 MED ORDER — SODIUM CHLORIDE 0.9 % IV SOLN
2.0000 g | INTRAVENOUS | Status: AC
  Administered 2018-09-06: 2 g via INTRAVENOUS
  Filled 2018-09-06: qty 2

## 2018-09-06 MED ORDER — ROCURONIUM BROMIDE 10 MG/ML (PF) SYRINGE
PREFILLED_SYRINGE | INTRAVENOUS | Status: AC
  Filled 2018-09-06: qty 10

## 2018-09-06 MED ORDER — IBUPROFEN 800 MG PO TABS: 800.0000 mg | ORAL_TABLET | Freq: Three times a day (TID) | ORAL | 1 refills | Status: AC | PRN

## 2018-09-06 MED ORDER — DEXAMETHASONE SODIUM PHOSPHATE 10 MG/ML IJ SOLN
INTRAMUSCULAR | Status: AC
  Filled 2018-09-06: qty 1

## 2018-09-06 MED ORDER — ONDANSETRON HCL 4 MG/2ML IJ SOLN
INTRAMUSCULAR | Status: AC
  Filled 2018-09-06: qty 2

## 2018-09-06 MED ORDER — SUGAMMADEX SODIUM 200 MG/2ML IV SOLN
INTRAVENOUS | Status: DC | PRN
  Administered 2018-09-06: 200 mg via INTRAVENOUS

## 2018-09-06 MED ORDER — KETOROLAC TROMETHAMINE 30 MG/ML IJ SOLN
INTRAMUSCULAR | Status: DC | PRN
  Administered 2018-09-06: 30 mg via INTRAVENOUS

## 2018-09-06 MED ORDER — PROPOFOL 10 MG/ML IV BOLUS
INTRAVENOUS | Status: AC
  Filled 2018-09-06: qty 40

## 2018-09-06 SURGICAL SUPPLY — 46 items
ADH SKN CLS APL DERMABOND .7 (GAUZE/BANDAGES/DRESSINGS) ×1
BAG RETRIEVAL 10MM (BASKET)
CANISTER SUCT 3000ML PPV (MISCELLANEOUS) IMPLANT
CANISTER SUCTION 1200CC (MISCELLANEOUS) IMPLANT
CATH ROBINSON RED A/P 16FR (CATHETERS) ×3 IMPLANT
COVER MAYO STAND STRL (DRAPES) ×3 IMPLANT
COVER WAND RF STERILE (DRAPES) ×3 IMPLANT
DERMABOND ADVANCED (GAUZE/BANDAGES/DRESSINGS) ×2
DERMABOND ADVANCED .7 DNX12 (GAUZE/BANDAGES/DRESSINGS) ×1 IMPLANT
DRSG COVADERM PLUS 2X2 (GAUZE/BANDAGES/DRESSINGS) ×2 IMPLANT
DURAPREP 26ML APPLICATOR (WOUND CARE) ×3 IMPLANT
ELECT REM PT RETURN 9FT ADLT (ELECTROSURGICAL) ×3
ELECTRODE REM PT RTRN 9FT ADLT (ELECTROSURGICAL) ×1 IMPLANT
GAUZE 4X4 16PLY RFD (DISPOSABLE) ×3 IMPLANT
GLOVE BIO SURGEON STRL SZ 6.5 (GLOVE) ×2 IMPLANT
GLOVE BIO SURGEONS STRL SZ 6.5 (GLOVE) ×1
GLOVE BIOGEL PI IND STRL 7.0 (GLOVE) ×1 IMPLANT
GLOVE BIOGEL PI INDICATOR 7.0 (GLOVE) ×2
GOWN STRL REUS W/TWL LRG LVL3 (GOWN DISPOSABLE) ×6 IMPLANT
HOLDER FOLEY CATH W/STRAP (MISCELLANEOUS) IMPLANT
KIT TURNOVER CYSTO (KITS) ×3 IMPLANT
NDL INSUFFLATION 14GA 150MM (NEEDLE) IMPLANT
NEEDLE INSUFFLATION 120MM (ENDOMECHANICALS) ×3 IMPLANT
NEEDLE INSUFFLATION 14GA 150MM (NEEDLE) IMPLANT
NS IRRIG 500ML POUR BTL (IV SOLUTION) ×2 IMPLANT
PACK LAPAROSCOPY BASIN (CUSTOM PROCEDURE TRAY) ×3 IMPLANT
PACK TRENDGUARD 450 HYBRID PRO (MISCELLANEOUS) ×1 IMPLANT
PAD OB MATERNITY 4.3X12.25 (PERSONAL CARE ITEMS) ×3 IMPLANT
PAD PREP 24X48 CUFFED NSTRL (MISCELLANEOUS) ×3 IMPLANT
SCISSORS LAP 5X35 DISP (ENDOMECHANICALS) IMPLANT
SEALER TISSUE G2 CVD JAW 45CM (ENDOMECHANICALS) IMPLANT
SET IRRIG TUBING LAPAROSCOPIC (IRRIGATION / IRRIGATOR) IMPLANT
SUT VIC AB 3-0 PS2 18 (SUTURE) ×3
SUT VIC AB 3-0 PS2 18XBRD (SUTURE) ×1 IMPLANT
SUT VICRYL 0 UR6 27IN ABS (SUTURE) ×3 IMPLANT
SYS BAG RETRIEVAL 10MM (BASKET)
SYSTEM BAG RETRIEVAL 10MM (BASKET) IMPLANT
TOWEL OR 17X26 10 PK STRL BLUE (TOWEL DISPOSABLE) ×3 IMPLANT
TRAY FOLEY W/BAG SLVR 14FR (SET/KITS/TRAYS/PACK) IMPLANT
TRENDGUARD 450 HYBRID PRO PACK (MISCELLANEOUS) ×3
TROCAR OPTI TIP 5M 100M (ENDOMECHANICALS) ×3 IMPLANT
TROCAR XCEL BLUNT TIP 100MML (ENDOMECHANICALS) IMPLANT
TROCAR XCEL NON-BLD 11X100MML (ENDOMECHANICALS) ×3 IMPLANT
TUBING EVAC SMOKE HEATED PNEUM (TUBING) ×3 IMPLANT
WARMER LAPAROSCOPE (MISCELLANEOUS) ×3 IMPLANT
WATER STERILE IRR 500ML POUR (IV SOLUTION) ×3 IMPLANT

## 2018-09-06 NOTE — Anesthesia Postprocedure Evaluation (Signed)
Anesthesia Post Note  Patient: Sabrina Carney  Procedure(s) Performed: LAPAROSCOPY DIAGNOSTIC (N/A Abdomen)     Patient location during evaluation: PACU Anesthesia Type: General Level of consciousness: awake and alert Pain management: pain level controlled Vital Signs Assessment: post-procedure vital signs reviewed and stable Respiratory status: spontaneous breathing, nonlabored ventilation and respiratory function stable Cardiovascular status: blood pressure returned to baseline and stable Postop Assessment: no apparent nausea or vomiting Anesthetic complications: no    Last Vitals:  Vitals:   09/06/18 0815 09/06/18 0830  BP: 118/74 116/81  Pulse: 78 (!) 58  Resp: (!) 25 17  Temp: (!) 36.3 C   SpO2: 100% 100%    Last Pain:  Vitals:   09/06/18 0815  TempSrc:   PainSc: [place]

## 2018-09-06 NOTE — Anesthesia Preprocedure Evaluation (Addendum)
Anesthesia Evaluation  Patient identified by MRN, date of birth, ID band Patient awake    Reviewed: Allergy & Precautions, NPO status , Patient's Chart, lab work & pertinent test results  History of Anesthesia Complications (+) PONVNegative for: history of anesthetic complications  Airway Mallampati: II  TM Distance: >3 FB Neck ROM: Full    Dental  (+) Teeth Intact   Pulmonary Current Smoker,    Pulmonary exam normal        Cardiovascular negative cardio ROS Normal cardiovascular exam     Neuro/Psych  Headaches, Seizures - (on Topamax, last seizure 1 year ago), Well Controlled,  PSYCHIATRIC DISORDERS Depression Bipolar Disorder    GI/Hepatic negative GI ROS, Neg liver ROS,   Endo/Other  negative endocrine ROS  Renal/GU negative Renal ROS  negative genitourinary   Musculoskeletal negative musculoskeletal ROS (+)   Abdominal   Peds  Hematology negative hematology ROS (+)   Anesthesia Other Findings   Reproductive/Obstetrics negative OB ROS                           Anesthesia Physical Anesthesia Plan  ASA: II  Anesthesia Plan: General   Post-op Pain Management:    Induction: Intravenous  PONV Risk Score and Plan: 4 or greater and Ondansetron, Dexamethasone, Midazolam, Treatment may vary due to age or medical condition and Scopolamine patch - Pre-op  Airway Management Planned: Oral ETT  Additional Equipment: None  Intra-op Plan:   Post-operative Plan: Extubation in OR  Informed Consent: I have reviewed the patients History and Physical, chart, labs and discussed the procedure including the risks, benefits and alternatives for the proposed anesthesia with the patient or authorized representative who has indicated his/her understanding and acceptance.     Dental advisory given  Plan Discussed with:   Anesthesia Plan Comments:        Anesthesia Quick Evaluation

## 2018-09-06 NOTE — Anesthesia Procedure Notes (Signed)
Procedure Name: Intubation Date/Time: 09/06/2018 7:31 AM Performed by: Justice Rocher, CRNA Pre-anesthesia Checklist: Patient identified, Emergency Drugs available, Suction available and Patient being monitored Patient Re-evaluated:Patient Re-evaluated prior to induction Oxygen Delivery Method: Circle system utilized Preoxygenation: Pre-oxygenation with 100% oxygen Induction Type: IV induction Ventilation: Mask ventilation without difficulty Laryngoscope Size: Mac and 3 Grade View: Grade II Tube type: Oral Tube size: 7.5 mm Number of attempts: 1 Airway Equipment and Method: Stylet and Oral airway Placement Confirmation: ETT inserted through vocal cords under direct vision,  positive ETCO2 and breath sounds checked- equal and bilateral Secured at: 22 cm Tube secured with: Tape Dental Injury: Teeth and Oropharynx as per pre-operative assessment

## 2018-09-06 NOTE — Discharge Instructions (Signed)
Laparoscopy/ Care After This sheet gives you information about how to care for yourself after your procedure. Your health care provider may also give you more specific instructions. If you have problems or questions, contact your health care provider. What can I expect after the procedure? After the procedure, it is common to have:  A sore throat.  Discomfort in your shoulder.  Mild discomfort or cramping in your abdomen.  Gas pains.  Pain or soreness in the area where the surgical incision was made.  A bloated feeling.  Tiredness.  Nausea.  Vomiting. Follow these instructions at home: Medicines  Take over-the-counter and prescription medicines only as told by your health care provider.  Do not take aspirin because it can cause bleeding.  Ask your health care provider if the medicine prescribed to you: ? Requires you to avoid driving or using heavy machinery. ? Can cause constipation. You may need to take actions to prevent or treat constipation, such as:  Drink enough fluid to keep your urine pale yellow.  Take over-the-counter or prescription medicines.  Eat foods that are high in fiber, such as beans, whole grains, and fresh fruits and vegetables.  Limit foods that are high in fat and processed sugars, such as fried or sweet foods. Incision care      Follow instructions from your health care provider about how to take care of your incision. Make sure you: ? Wash your hands with soap and water before and after you change your bandage (dressing). If soap and water are not available, use hand sanitizer. ? Change your dressing as told by your health care provider. ? Leave stitches (sutures), skin glue, or adhesive strips in place. These skin closures may need to stay in place for 2 weeks or longer. If adhesive strip edges start to loosen and curl up, you may trim the loose edges. Do not remove adhesive strips completely unless your health care provider tells you to do  that.  Check your incision area every day for signs of infection. Check for: ? Redness, swelling, or pain. ? Fluid or blood. ? Warmth. ? Pus or a bad smell. Activity  Rest as told by your health care provider.  Avoid sitting for a long time without moving. Get up to take short walks every 1-2 hours. This is important to improve blood flow and breathing. Ask for help if you feel weak or unsteady.  Return to your normal activities as told by your health care provider. Ask your health care provider what activities are safe for you. General instructions  Do not take baths, swim, or use a hot tub until your health care provider approves. Ask your health care provider if you may take showers. You may only be allowed to take sponge baths.  Have someone help you with your daily household tasks for the first few days.  Keep all follow-up visits as told by your health care provider. This is important. Contact a health care provider if:  You have redness, swelling, or pain around your incision.  Your incision feels warm to the touch.  You have pus or a bad smell coming from your incision.  The edges of your incision break open after the sutures have been removed.  Your pain does not improve after 2-3 days.  You have a rash.  You repeatedly become dizzy or light-headed.  Your pain medicine is not helping. Get help right away if you:  Have a fever.  Faint.  Have increasing pain in  your abdomen.  Have severe pain in one or both of your shoulders.  Have fluid or blood coming from your sutures or from your vagina.  Have shortness of breath or difficulty breathing.  Have chest pain or leg pain.  Have ongoing nausea, vomiting, or diarrhea. Summary  After the procedure, it is common to have mild discomfort or cramping in your abdomen.  Take over-the-counter and prescription medicines only as told by your health care provider.  Watch for symptoms that should prompt you to  call your health care provider.  Keep all follow-up visits as told by your health care provider. This is important. This information is not intended to replace advice given to you by your health care provider. Make sure you discuss any questions you have with your health care provider. Document Released: 09/02/2004 Document Revised: 01/08/2018 Document Reviewed: 01/08/2018 Elsevier Patient Education  [place] Instructions  Activity: Get plenty of rest for the remainder of the day. A responsible individual must stay with you for 24 hours following the procedure.  For the next 24 hours, DO NOT: -Drive a car -Paediatric nurse -Drink alcoholic beverages -Take any medication unless instructed by your physician -Make any legal decisions or sign important papers.  Meals: Start with liquid foods such as gelatin or soup. Progress to regular foods as tolerated. Avoid greasy, spicy, heavy foods. If nausea and/or vomiting occur, drink only clear liquids until the nausea and/or vomiting subsides. Call your physician if vomiting continues.  Special Instructions/Symptoms: Your throat may feel dry or sore from the anesthesia or the breathing tube placed in your throat during surgery. If this causes discomfort, gargle with warm salt water. The discomfort should disappear within 24 hours.  If you had a scopolamine patch placed behind your ear for the management of post- operative nausea and/or vomiting:  1. The medication in the patch is effective for 72 hours, after which it should be removed.  Wrap patch in a tissue and discard in the trash. Wash hands thoroughly with soap and water. 2. You may remove the patch earlier than 72 hours if you experience unpleasant side effects which may include dry mouth, dizziness or visual disturbances. 3. Avoid touching the patch. Wash your hands with soap and water after contact with the patch.

## 2018-09-06 NOTE — Op Note (Signed)
Diagnostic Laparoscopy Procedure Note  Indications: The patient is a 33 y.o. female with chronic pelvic pain.  Pre-operative Diagnosis: pelvic pain, dysmenorrhea  Post-operative Diagnosis: same  Surgeon: Marylynn Pearson   Anesthesia: General endotracheal anesthesia   Procedure Details  The patient was seen in the Holding Room. The risks, benefits, complications, treatment options, and expected outcomes were discussed with the patient. The possibilities of reaction to medication, pulmonary aspiration, perforation of viscus, bleeding, recurrent infection, the need for additional procedures, failure to diagnose a condition, and creating a complication requiring transfusion or operation were discussed with the patient. The patient concurred with the proposed plan, giving informed consent. The patient was taken to the Operating Room, identified as Sabrina Carney and the procedure verified as Diagnostic Laparoscopy. A Time Out was held and the above information confirmed.  After induction of general anesthesia, the patient was placed in modified dorsal lithotomy position where she was prepped, draped, and catheterized in the normal, sterile fashion.  The cervix was visualized and an intrauterine manipulator was placed. A small umbilical incision was then performed.Optical trocar was inserted under direct visualization.  Pelvis and right upper quadrant appear normal.  No evidence of endometriosis or adhesions identified.    Following the procedure the umbilical sheath was removed after intra-abdominal carbon dioxide was expressed. The incision was closed with subcutaneous and subcuticular sutures of 4-0 Vicryl. The intrauterine manipulator was then removed.  Instrument, sponge, and needle counts were correct prior to abdominal closure and at the conclusion of the case.   Findings: The anterior cul-de-sac and round ligaments normal The uterus normal The adnexa normal Cul-de-sac  normal  Estimated Blood Loss:  Less than 50cc                Specimens: none             Complications:  None; patient tolerated the procedure well.         Disposition: PACU - hemodynamically stable.         Condition: stable

## 2018-09-06 NOTE — Transfer of Care (Signed)
Immediate Anesthesia Transfer of Care Note  Patient: Sabrina Carney  Procedure(s) Performed: Procedure(s) (LRB): LAPAROSCOPY DIAGNOSTIC (N/A)  Patient Location: PACU  Anesthesia Type: General  Level of Consciousness: awake, sedated, patient cooperative and responds to stimulation  Airway & Oxygen Therapy: Patient Spontanous Breathing and Patient connected to [place] O2  face mask   Post-op Assessment: Report given to PACU RN, Post -op Vital signs reviewed and stable and Patient moving all extremities  Post vital signs: Reviewed and stable  Complications: No apparent anesthesia complications

## 2018-09-09 ENCOUNTER — Encounter (HOSPITAL_BASED_OUTPATIENT_CLINIC_OR_DEPARTMENT_OTHER): Payer: Self-pay | Admitting: Obstetrics and Gynecology

## 2018-09-09 NOTE — Progress Notes (Signed)
Pt states she has had pressure in her chest since the surgery due to the gas pain. The pain lessens after she burps but it returns shortly after that. She also has blurred vision. States she had an appointment on the 27th but it was cancelled. She has been walking for short periods and her bleeding has increased a bit since ambulating. She also has "no idea" what happened yesterday since she did not see the surgeon after the procedure and no one called her mother. Instructed pt to call MD today and discuss all the above as she may need to be seen in the office.

## 2018-09-27 ENCOUNTER — Encounter (HOSPITAL_BASED_OUTPATIENT_CLINIC_OR_DEPARTMENT_OTHER): Payer: Self-pay

## 2018-09-27 ENCOUNTER — Emergency Department (HOSPITAL_BASED_OUTPATIENT_CLINIC_OR_DEPARTMENT_OTHER)
Admission: EM | Admit: 2018-09-27 | Discharge: 2018-09-27 | Disposition: A | Discharge status: Home / Self Care | Payer: 59 | Attending: Emergency Medicine | Admitting: Emergency Medicine

## 2018-09-27 ENCOUNTER — Other Ambulatory Visit: Payer: Self-pay

## 2018-09-27 ENCOUNTER — Emergency Department (HOSPITAL_BASED_OUTPATIENT_CLINIC_OR_DEPARTMENT_OTHER): Payer: 59

## 2018-09-27 DIAGNOSIS — Z87891 Personal history of nicotine dependence: Secondary | ICD-10-CM | POA: Diagnosis not present

## 2018-09-27 DIAGNOSIS — N83202 Unspecified ovarian cyst, left side: Secondary | ICD-10-CM | POA: Insufficient documentation

## 2018-09-27 DIAGNOSIS — Z79899 Other long term (current) drug therapy: Secondary | ICD-10-CM | POA: Insufficient documentation

## 2018-09-27 DIAGNOSIS — R109 Unspecified abdominal pain: Secondary | ICD-10-CM | POA: Diagnosis present

## 2018-09-27 DIAGNOSIS — I10 Essential (primary) hypertension: Secondary | ICD-10-CM | POA: Diagnosis not present

## 2018-09-27 DIAGNOSIS — N200 Calculus of kidney: Secondary | ICD-10-CM | POA: Diagnosis not present

## 2018-09-27 HISTORY — DX: Other specified conditions associated with female genital organs and menstrual cycle: N94.89

## 2018-09-27 LAB — URINALYSIS, ROUTINE W REFLEX MICROSCOPIC
Bilirubin Urine: NEGATIVE
Glucose, UA: NEGATIVE mg/dL
Ketones, ur: NEGATIVE mg/dL
Leukocytes,Ua: NEGATIVE
Nitrite: NEGATIVE
Protein, ur: NEGATIVE mg/dL
Specific Gravity, Urine: >1.03 — ABNORMAL HIGH (ref 1.005–1.030)
pH: 6 (ref 5.0–8.0)

## 2018-09-27 LAB — CBC WITH DIFFERENTIAL/PLATELET
Abs Immature Granulocytes: 0.02 10*3/uL (ref 0.00–0.07)
Basophils Absolute: 0.1 10*3/uL (ref 0.0–0.1)
Basophils Relative: 1 %
Eosinophils Absolute: 0.2 10*3/uL (ref 0.0–0.5)
Eosinophils Relative: 2 %
HCT: 41 % (ref 36.0–46.0)
Hemoglobin: 13.3 g/dL (ref 12.0–15.0)
Immature Granulocytes: 0 %
Lymphocytes Relative: 29 %
Lymphs Abs: 3.1 10*3/uL (ref 0.7–4.0)
MCH: 29.8 pg (ref 26.0–34.0)
MCHC: 32.4 g/dL (ref 30.0–36.0)
MCV: 91.7 fL (ref 80.0–100.0)
Monocytes Absolute: 0.8 10*3/uL (ref 0.1–1.0)
Monocytes Relative: 7 %
Neutro Abs: 6.5 10*3/uL (ref 1.7–7.7)
Neutrophils Relative %: 61 %
Platelets: 227 10*3/uL (ref 150–400)
RBC: 4.47 MIL/uL (ref 3.87–5.11)
RDW: 12.3 % (ref 11.5–15.5)
WBC: 10.7 10*3/uL — ABNORMAL HIGH (ref 4.0–10.5)
nRBC: 0 % (ref 0.0–0.2)

## 2018-09-27 LAB — COMPREHENSIVE METABOLIC PANEL
ALT: 28 U/L (ref 0–44)
AST: 19 U/L (ref 15–41)
Albumin: 4.4 g/dL (ref 3.5–5.0)
Alkaline Phosphatase: 59 U/L (ref 38–126)
Anion gap: 10 (ref 5–15)
BUN: 10 mg/dL (ref 6–20)
CO2: 19 mmol/L — ABNORMAL LOW (ref 22–32)
Calcium: 9.1 mg/dL (ref 8.9–10.3)
Chloride: 109 mmol/L (ref 98–111)
Creatinine, Ser: 0.83 mg/dL (ref 0.44–1.00)
GFR calc Af Amer: >60 mL/min (ref >60)
GFR calc non Af Amer: >60 mL/min (ref >60)
Glucose, Bld: 92 mg/dL (ref 70–99)
Potassium: 3.5 mmol/L (ref 3.5–5.1)
Sodium: 138 mmol/L (ref 135–145)
Total Bilirubin: 0.3 mg/dL (ref 0.3–1.2)
Total Protein: 7.3 g/dL (ref 6.5–8.1)

## 2018-09-27 LAB — URINALYSIS, MICROSCOPIC (REFLEX)

## 2018-09-27 LAB — LIPASE, BLOOD: Lipase: 32 U/L (ref 11–51)

## 2018-09-27 LAB — PREGNANCY, URINE: Preg Test, Ur: NEGATIVE

## 2018-09-27 MED ORDER — ONDANSETRON HCL 4 MG/2ML IJ SOLN
4.0000 mg | Freq: Once | INTRAMUSCULAR | Status: AC
  Administered 2018-09-27: 4 mg via INTRAVENOUS
  Filled 2018-09-27: qty 2

## 2018-09-27 MED ORDER — ONDANSETRON 4 MG PO TBDP: 4.0000 mg | ORAL_TABLET | Freq: Three times a day (TID) | ORAL | 0 refills | Status: AC | PRN

## 2018-09-27 MED ORDER — KETOROLAC TROMETHAMINE 30 MG/ML IJ SOLN
30.0000 mg | Freq: Once | INTRAMUSCULAR | Status: AC
  Administered 2018-09-27: 19:00:00 30 mg via INTRAVENOUS
  Filled 2018-09-27: qty 1

## 2018-09-27 MED ORDER — TAMSULOSIN HCL 0.4 MG PO CAPS: 0.4000 mg | ORAL_CAPSULE | Freq: Every day | ORAL | 0 refills | Status: AC

## 2018-09-27 NOTE — ED Triage Notes (Signed)
Pt c/o left flank pain x 5 days-states she was seen at [facility] for same with blood in urine and US-was advised to f/u with urology but states pain is too bad to wait -pt NAD-steady gait

## 2018-09-27 NOTE — ED Notes (Signed)
Radiology came  Back with reports saying it is not a ring from a F/C it is a cell structure per Pt.

## 2018-09-27 NOTE — ED Notes (Signed)
Pt reports the last time she urinated without having to push real hard was Sunday.  Pt. Was seen at [facility] yesterday and today.. Was told to come to ED due to poss. Ring in her bladder from a F/C that was placed from a procedure done at [facility] on the 10th of July for a diagnostic surgery.

## 2018-09-27 NOTE — ED Provider Notes (Signed)
[facility] EMERGENCY DEPARTMENT Provider Note   CSN: [id number] Arrival date & time: 09/27/18  1632    History   Chief Complaint Chief Complaint  Patient presents with  . Flank Pain    HPI Sabrina Carney is a 33 y.o. female with past medical history of kidney stones, pelvic congestion syndrome, seizures on Topamax presents emergency department today with chief complaint of left flank pain x5 days.  Patient reports associated hematuria.  She has been seen at urgent care and was told she had blood in her urine and had an ultrasound.  She is not sure what the results were exactly but she was told to follow-up with urology.  She states the pain in her left flank is sharp.  It radiates down to her groin.  She rates the pain 7 out of 10 in severity.  Pain is worse with movement or long periods of standing.  She has taken ibuprofen with minimal symptom relief.  She reports associated nausea and nonbloody nonbilious emesis.  She had one episode in the last 24 hours. She admits to difficulty doing her bladder.  She states she is only to urinate small amounts but feels like she has more left in her bladder.  Denies fever, chills, chest pain, shortness of breath, gross hematuria, diarrhea, pelvic pain, vaginal discharge, vaginal pain.  Review shows patient had recent laparoscopic diagnostic surgery for possible endometriosis.  It was found that she does not have endometriosis however has pelvic congestion syndrome. She was discharged home with percocet for pain. Pt states has not been taking percocet because she tried to avoid narcotics.  Past Medical History:  Diagnosis Date  . Common migraine with intractable migraine 02/22/2017  . Depression    not on meds, doing well  . Hypertension    During pregnancy 2015  . Infection    UTI  . Kidney stones    with infections  . Migraines   . Pelvic congestion syndrome   . Seizures (HCC)    related to heat or not eating  . Syncope  02/22/2017    Patient Active Problem List   Diagnosis Date Noted  . Chronic migraine without aura without status migrainosus, not intractable 05/01/2018  . Syncope 02/22/2017  . Common migraine with intractable migraine 02/22/2017  . Right hand pain 01/12/2016  . Depression 01/12/2016  . Fatigue 01/12/2016  . Muscle ache 01/12/2016  . 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 Gestational hypertension 10/21/2013  . Kidney stones     Past Surgical History:  Procedure Laterality Date  . HAND SURGERY     left; cyst removed  . LAPAROSCOPY N/A 09/06/2018   Procedure: LAPAROSCOPY DIAGNOSTIC;  Surgeon: Zelphia CairoAdkins, Gretchen, MD;  [facility];  Service: Gynecology;  Laterality: N/A;  . WISDOM TOOTH EXTRACTION       OB History    Gravida  3   Para  1   Term  1   Preterm      AB  2   Living  1     SAB  2   TAB      Ectopic      Multiple      Live Births  1            Home Medications    Prior to Admission medications   Medication Sig Start Date End Date Taking? Authorizing Provider  albuterol (VENTOLIN HFA) 108 (90 Base) MCG/ACT inhaler Inhale 2 puffs into the lungs  every 4 (four) hours as needed for wheezing or shortness of breath. 09/13/16   Quentin Mullingollier, Amanda, PA-C  benzonatate (TESSALON) 200 MG capsule Take 1 capsule (200 mg total) by mouth 2 (two) times daily as needed for cough. 02/09/18   Zachery DauerGraham, Elysa, NP  ibuprofen (ADVIL) 800 MG tablet Take 1 tablet (800 mg total) by mouth every 8 (eight) hours as needed. 09/06/18   Zelphia CairoAdkins, Gretchen, MD  ondansetron (ZOFRAN ODT) 4 MG disintegrating tablet Take 1 tablet (4 mg total) by mouth every 8 (eight) hours as needed for nausea or vomiting. 09/27/18   Albrizze, Caroleen HammanKaitlyn E, PA-C  oxyCODONE-acetaminophen (PERCOCET) 5-325 MG tablet Take 2 tablets by mouth every 4 (four) hours as needed for severe pain. 09/06/18   Zelphia CairoAdkins, Gretchen, MD  tamsulosin (FLOMAX) 0.4 MG CAPS capsule Take 1 capsule (0.4 mg total)  by mouth daily after supper for 7 days. 09/27/18 10/04/18  Albrizze, Kaitlyn E, PA-C  topiramate (TOPAMAX) 25 MG tablet Take 3 tablets (75 mg total) by mouth daily. 05/01/18   Lomax, Amy, NP    Family History Family History  Problem Relation Age of Onset  . Stroke Maternal Grandmother        great grandma  . Hypertension Maternal Grandmother   . Migraines Maternal Grandmother   . Heart disease Maternal Grandfather   . Diabetes Paternal Grandmother   . Depression Mother   . Migraines Mother   . Depression Father   . Mental illness Father        bipolar  . Heart disease Maternal Uncle   . Depression Sister   . Mental illness Brother        bipolar (2)  . Hearing loss Neg Hx     Social History Social History   Tobacco Use  . Smoking status: Former Smoker    Types: Cigarettes  . Smokeless tobacco: Never Used  Substance Use Topics  . Alcohol use: Not Currently  . Drug use: No     Allergies   Valproic acid, Hydrocodone, and Other   Review of Systems Review of Systems  Constitutional: Negative for chills and fever.  HENT: Negative for congestion, ear discharge, ear pain, sinus pressure, sinus pain and sore throat.   Eyes: Negative for pain and redness.  Respiratory: Negative for cough and shortness of breath.   Cardiovascular: Negative for chest pain.  Gastrointestinal: Positive for nausea and vomiting. Negative for abdominal pain, constipation and diarrhea.  Genitourinary: Positive for difficulty urinating and flank pain. Negative for dysuria, hematuria, pelvic pain, vaginal bleeding, vaginal discharge and vaginal pain.  Musculoskeletal: Negative for back pain and neck pain.  Skin: Negative for wound.  Neurological: Negative for weakness, numbness and headaches.     Physical Exam Updated Vital Signs BP 123/73 (BP Location: Left Arm)   Pulse 72   Temp 98.1 F (36.7 C) (Oral)   Resp 16   Ht 5\' 10"  (1.778 m)   Wt 84.8 kg   LMP 08/30/2018   SpO2 99%   BMI 26.83  kg/m   Physical Exam Vitals signs and nursing note reviewed.  Constitutional:      General: She is not in acute distress.    Appearance: She is not ill-appearing.  HENT:     Head: Normocephalic and atraumatic.     Right Ear: Tympanic membrane and external ear normal.     Left Ear: Tympanic membrane and external ear normal.     Nose: Nose normal.     Mouth/Throat:  Mouth: Mucous membranes are moist.     Pharynx: Oropharynx is clear.  Eyes:     General: No scleral icterus.       Right eye: No discharge.        Left eye: No discharge.     Extraocular Movements: Extraocular movements intact.     Conjunctiva/sclera: Conjunctivae normal.     Pupils: Pupils are equal, round, and reactive to light.  Neck:     Musculoskeletal: Normal range of motion.     Vascular: No JVD.  Cardiovascular:     Rate and Rhythm: Normal rate and regular rhythm.     Pulses: Normal pulses.          Radial pulses are 2+ on the right side and 2+ on the left side.     Heart sounds: Normal heart sounds.  Pulmonary:     Comments: Lungs clear to auscultation in all fields. Symmetric chest rise. No wheezing, rales, or rhonchi. Abdominal:     General: Bowel sounds are normal.     Tenderness: There is left CVA tenderness. There is no right CVA tenderness.       Comments: Abdomen is soft, non-distended.  Tenderness to palpation of suprapubic area.  No rigidity, no guarding. No peritoneal signs.  Musculoskeletal: Normal range of motion.  Skin:    General: Skin is warm and dry.     Capillary Refill: Capillary refill takes less than 2 seconds.  Neurological:     Mental Status: She is oriented to person, place, and time.     GCS: GCS eye subscore is 4. GCS verbal subscore is 5. GCS motor subscore is 6.     Comments: Fluent speech, no facial droop.  Psychiatric:        Behavior: Behavior normal.      ED Treatments / Results  Labs (all labs ordered are listed, but only abnormal results are displayed) Labs  Reviewed  URINALYSIS, ROUTINE W REFLEX MICROSCOPIC - Abnormal; Notable for the following components:      Result Value   APPearance CLOUDY (*)    Specific Gravity, Urine >1.030 (*)    Hgb urine dipstick MODERATE (*)    All other components within normal limits  CBC WITH DIFFERENTIAL/PLATELET - Abnormal; Notable for the following components:   WBC 10.7 (*)    All other components within normal limits  COMPREHENSIVE METABOLIC PANEL - Abnormal; Notable for the following components:   CO2 19 (*)    All other components within normal limits  URINALYSIS, MICROSCOPIC (REFLEX) - Abnormal; Notable for the following components:   Bacteria, UA FEW (*)    All other components within normal limits  PREGNANCY, URINE  LIPASE, BLOOD    EKG None  Radiology CT ABDOMEN AND PELVIS WITHOUT CONTRAST TECHNIQUE: Multidetector CT imaging of the abdomen and pelvis was performed following the standard protocol without oral or IV contrast. COMPARISON: None.  FINDINGS: Lower chest: There is slight posterior bibasilar atelectasis. There is no lung base edema or consolidation. Hepatobiliary: No focal liver lesions are evident on this noncontrast enhanced study. The gallbladder wall is not appreciably thickened. There is no biliary duct dilatation. Pancreas: There is no pancreatic mass or inflammatory focus. Spleen: There is a cyst in the medial spleen measuring 2.4 x 2.2 cm. No other splenic lesions are evident on this noncontrast enhanced study. Adrenals/Urinary Tract: Adrenals bilaterally appear normal. Kidneys bilaterally show no evident mass. There is minimal hydronephrosis on the left. There is no hydronephrosis on the  right. There is no intrarenal calculus on either side. There is a calculus at the left ureteropelvic junction measuring 6 x 5 mm. No other ureteral calculi are identified. Note that there are multiple phleboliths in the pelvis which are near but felt to be separate from the distal ureters.  Urinary bladder is midline with wall thickness within normal limits. Stomach/Bowel: There is no appreciable bowel wall or mesenteric thickening. There is no evident bowel obstruction. There is no free air or portal venous air. Urinary bladder is midline with wall thickness within normal limits. Vascular/Lymphatic: There is no abdominal aortic aneurysm. No vascular lesions are evident on this noncontrast enhanced study. There is no adenopathy in the abdomen or pelvis. Reproductive: The uterus is anteverted. There is a cyst arising from the left ovary/adnexa measuring 4.6 x 4.1 x 3.8 cm period no other pelvic masses are evident. There is a small amount of fluid in the cul-de-sac. Other: Appendix appears normal. No abscess evident in the abdomen or pelvis. There is no ascites beyond mild fluid in the cul-de-sac. There is a rather minimal ventral hernia containing only fat. Musculoskeletal: No blastic or lytic bone lesions. No intramuscular or abdominal wall lesion.  IMPRESSION: 1. 6 x 5 mm calculus at the left ureteropelvic junction with minimal hydronephrosis on the left. 2. 4.6 x 4.1 x 3.8 cm left ovarian cyst. 3. Mild fluid in the cul-de-sac region. Suspect recent ovarian cyst leakage/rupture. 4. No evident bowel obstruction. No abscess in the abdomen or pelvis. Appendix appears normal.5. Minimal ventral hernia containing only fat. Electronically SignedBy: Bretta Bang III M.DOn: 09/27/2018 19:31    Procedures Procedures (including critical care time)  Medications Ordered in ED Medications  ketorolac (TORADOL) 30 MG/ML injection 30 mg (30 mg Intravenous Given 09/27/18 1928)  ondansetron (ZOFRAN) injection 4 mg (4 mg Intravenous Given 09/27/18 1928)     Initial Impression / Assessment and Plan / ED Course  I have reviewed the triage vital signs and the nursing notes.  Pertinent labs & imaging results that were available during my care of the patient were reviewed by me and considered  in my medical decision making (see chart for details).  33 yo female presents with left flank pain. She appears uncomfortable, otherwise non toxic. She is afebrile with normal vital signs. On exam she has left CVA tenderness and mild tenderness to suprapubic area. No peritoneal signs. Her surgical incision is well appearing, no signs of infection. Bladder scanner with 350cc. Pt voided twice while in ED, with output measuring 150 and 200. No emesis during ED visit. Labs show mild leukocytosis of 10.7. UA without signs if infection. CMP and lipase unremarkable. CT renal shows 6x64mm kidney stone ureteropelvic junction with minimal hydronephrosis. Also shows left ovarian cyst and mild free fluid in pelvis suggesting recent cyst rupture. On reassessment pt's pain has improved with Toradol. She is tolerating PO intake. Findings and plan of care discussed with supervising physician Dr. Fredderick Phenix. Plan to discharge home with symptomatic treatment including flomax and zofran. Pt has percocet at home, declines needs for more pain medication.  Patient is hemodynamically stable, in NAD, and able to ambulate in the ED. Evaluation does not show pathology that would require ongoing emergent intervention or inpatient treatment. I explained the diagnosis to the patient. Patient is comfortable with above plan and is stable for discharge at this time. All questions were answered prior to disposition.  Strict return precautions for returning to the ED were discussed. Encouraged close follow up with  urology and gynecology.  This note was prepared using Dragon voice recognition software and may include unintentional dictation errors due to the inherent limitations of voice recognition software.    Final Clinical Impressions(s) / ED Diagnoses   Final diagnoses:  Flank pain  Nephrolithiasis  Cyst of left ovary    ED Discharge Orders         Ordered    tamsulosin (FLOMAX) 0.4 MG CAPS capsule  Daily after supper      09/27/18 2246    ondansetron (ZOFRAN ODT) 4 MG disintegrating tablet  Every 8 hours PRN     09/27/18 2246           Albrizze, Caroleen HammanKaitlyn E, PA-C 09/30/18 1000    Rolan BuccoBelfi, Melanie, MD 10/03/18 1111

## 2018-09-27 NOTE — ED Notes (Signed)
PA at bedside.

## 2018-09-27 NOTE — ED Notes (Signed)
Bladder scanner taken to room .... scanned Pt. Bladder approx. 363mls in bladder at present time when scanned

## 2018-09-27 NOTE — ED Notes (Signed)
ED provider at bedside discussing results with patient at this time.

## 2018-09-27 NOTE — Discharge Instructions (Addendum)
You were seen in the emergency department and found to have a kidney stone.  We are sending you home with multiple medications to assist with passing the stone:  ° °-Flomax-this is a medication to help pass the stone, it allows urine to exit the body more freely.  Please take this once daily with a meal. ° °-Percocet-this is a narcotic/controlled substance medication that has potential addicting qualities.  We recommend that you take 1-2 tablets every 6 hours as needed for severe pain.  Do not drive or operate heavy machinery when taking this medicine as it can be sedating. Do not drink alcohol or take other sedating medications when taking this medicine for safety reasons.  Keep this out of reach of small children.  Please be aware this medicine has Tylenol in it (325 mg/tab) do not exceed the maximum dose of Tylenol in a day per over the counter recommendations should you decide to supplement with Tylenol over the counter.  ° °-Zofran-this is an antinausea medication, you may take this every 8 hours as needed for nausea and vomiting, please allow the tablet to dissolve underneath of your tongue.  ° °We have prescribed you new medication(s) today. Discuss the medications prescribed today with your pharmacist as they can have adverse effects and interactions with your other medicines including over the counter and prescribed medications. Seek medical evaluation if you start to experience new or abnormal symptoms after taking one of these medicines, seek care immediately if you start to experience difficulty breathing, feeling of your throat closing, facial swelling, or rash as these could be indications of a more serious allergic reaction ° °Please follow-up with the urology group provided in your discharge instructions within 3 to 5 days.  Return to the ER for new or worsening symptoms including but not limited to worsening pain not controlled by these medicines, inability to keep fluids down, fever, or any other  concerns that you may have. °

## 2018-09-30 ENCOUNTER — Other Ambulatory Visit: Payer: Self-pay | Admitting: Urology

## 2018-10-04 ENCOUNTER — Other Ambulatory Visit: Payer: Self-pay | Admitting: Urology

## 2018-10-06 NOTE — H&P (Signed)
H&P  Chief Complaint: Kidney stone  History of Present Illness: Sabrina Carney is a 33 y.o. year old female who presents for ESL of a 6 mm left UPJ stone.  Past Medical History:  Diagnosis Date  . Common migraine with intractable migraine 02/22/2017  . Depression    not on meds, doing well  . Hypertension    During pregnancy 2015  . Infection    UTI  . Kidney stones    with infections  . Migraines   . Pelvic congestion syndrome   . Seizures ([place])    related to heat or not eating  . Syncope 02/22/2017    Past Surgical History:  Procedure Laterality Date  . HAND SURGERY     left; cyst removed  . LAPAROSCOPY N/A 09/06/2018   Procedure: LAPAROSCOPY DIAGNOSTIC;  Surgeon: Marylynn Pearson, MD;  Location: [facility];  Service: Gynecology;  Laterality: N/A;  . WISDOM TOOTH EXTRACTION      Home Medications:  No medications prior to admission.    Allergies:  Allergies  Allergen Reactions  . Doxycycline Anaphylaxis  . Valproic Acid     Other reaction(s): Psychosis (intolerance) With Hallucinations  . Hydrocodone Nausea And Vomiting  . Other Rash    Nitrile exam gloves    Family History  Problem Relation Age of Onset  . Stroke Maternal Grandmother        great grandma  . Hypertension Maternal Grandmother   . Migraines Maternal Grandmother   . Heart disease Maternal Grandfather   . Diabetes Paternal Grandmother   . Depression Mother   . Migraines Mother   . Depression Father   . Mental illness Father        bipolar  . Heart disease Maternal Uncle   . Depression Sister   . Mental illness Brother        bipolar (2)  . Hearing loss Neg Hx     Social History:  reports that she has quit smoking. Her smoking use included cigarettes. She has never used smokeless tobacco. She reports previous alcohol use. She reports that she does not use drugs.  ROS: A complete review of systems was performed.  All systems are negative except for pertinent findings  as noted.  Physical Exam:  Vital signs in last 24 hours:   General:  Alert and oriented, No acute distress HEENT: Normocephalic, atraumatic Neck: No JVD or lymphadenopathy Cardiovascular: Regular rate and rhythm Lungs: Clear bilaterally Abdomen: Soft, nontender, nondistended, no abdominal masses Back: No CVA tenderness Extremities: No edema Neurologic: Grossly intact  Laboratory Data:  No results found for this or any previous visit (from the past 24 hour(s)). No results found for this or any previous visit (from the past 240 hour(s)). Creatinine: No results for input(s): CREATININE in the last 168 hours.  Radiologic Imaging: No results found.  Impression/Assessment:  6 mm left UPJ stone  Plan:  ESL--1st part of a possible staged procedure  [name] 10/06/2018, 8:15 PM  [name]. Arbor Leer MD

## 2018-10-07 ENCOUNTER — Ambulatory Visit (HOSPITAL_COMMUNITY): Payer: 59

## 2018-10-07 ENCOUNTER — Ambulatory Visit (HOSPITAL_COMMUNITY)
Admission: RE | Admit: 2018-10-07 | Discharge: 2018-10-07 | Disposition: A | Discharge status: Home / Self Care | Payer: 59 | Source: Other Acute Inpatient Hospital | Attending: Urology | Admitting: Urology

## 2018-10-07 ENCOUNTER — Encounter (HOSPITAL_COMMUNITY): Payer: Self-pay | Admitting: General Practice

## 2018-10-07 ENCOUNTER — Encounter (HOSPITAL_COMMUNITY)
Admission: RE | Disposition: A | Discharge status: Home / Self Care | Payer: Self-pay | Source: Other Acute Inpatient Hospital | Attending: Urology

## 2018-10-07 DIAGNOSIS — Z87442 Personal history of urinary calculi: Secondary | ICD-10-CM | POA: Diagnosis not present

## 2018-10-07 DIAGNOSIS — Z87891 Personal history of nicotine dependence: Secondary | ICD-10-CM | POA: Insufficient documentation

## 2018-10-07 DIAGNOSIS — N201 Calculus of ureter: Secondary | ICD-10-CM | POA: Diagnosis not present

## 2018-10-07 DIAGNOSIS — N200 Calculus of kidney: Secondary | ICD-10-CM

## 2018-10-07 DIAGNOSIS — Z885 Allergy status to narcotic agent status: Secondary | ICD-10-CM | POA: Insufficient documentation

## 2018-10-07 DIAGNOSIS — Z881 Allergy status to other antibiotic agents status: Secondary | ICD-10-CM | POA: Insufficient documentation

## 2018-10-07 DIAGNOSIS — Z888 Allergy status to other drugs, medicaments and biological substances status: Secondary | ICD-10-CM | POA: Diagnosis not present

## 2018-10-07 DIAGNOSIS — N202 Calculus of kidney with calculus of ureter: Secondary | ICD-10-CM | POA: Diagnosis present

## 2018-10-07 HISTORY — PX: EXTRACORPOREAL SHOCK WAVE LITHOTRIPSY: SHX1557

## 2018-10-07 LAB — PREGNANCY, URINE: Preg Test, Ur: NEGATIVE

## 2018-10-07 SURGERY — LITHOTRIPSY, ESWL
Anesthesia: LOCAL | Laterality: Left

## 2018-10-07 MED ORDER — TAMSULOSIN HCL 0.4 MG PO CAPS: 0.4000 mg | ORAL_CAPSULE | Freq: Every day | ORAL | 0 refills | Status: AC

## 2018-10-07 MED ORDER — DIAZEPAM 5 MG PO TABS
10.0000 mg | ORAL_TABLET | ORAL | Status: AC
  Administered 2018-10-07: 10 mg via ORAL
  Filled 2018-10-07: qty 2

## 2018-10-07 MED ORDER — SODIUM CHLORIDE 0.9 % IV SOLN
INTRAVENOUS | Status: DC
  Administered 2018-10-07: 09:00:00 via INTRAVENOUS

## 2018-10-07 MED ORDER — KETOROLAC TROMETHAMINE 30 MG/ML IJ SOLN: 30.0000 mg | Freq: Once | INTRAMUSCULAR | 0 refills | Status: AC

## 2018-10-07 MED ORDER — DIPHENHYDRAMINE HCL 25 MG PO CAPS
25.0000 mg | ORAL_CAPSULE | ORAL | Status: AC
  Administered 2018-10-07: 25 mg via ORAL
  Filled 2018-10-07: qty 1

## 2018-10-07 MED ORDER — CIPROFLOXACIN HCL 500 MG PO TABS
500.0000 mg | ORAL_TABLET | ORAL | Status: AC
  Administered 2018-10-07: 500 mg via ORAL
  Filled 2018-10-07: qty 1

## 2018-10-07 SURGICAL SUPPLY — 4 items
COVER SURGICAL LIGHT HANDLE (MISCELLANEOUS) ×2 IMPLANT
COVER WAND RF STERILE (DRAPES) IMPLANT
KIT TURNOVER KIT A (KITS) IMPLANT
TOWEL OR 17X26 10 PK STRL BLUE (TOWEL DISPOSABLE) ×2 IMPLANT

## 2018-10-07 NOTE — Discharge Instructions (Signed)
See [facility] discharge instructions in chart.  New medicine sent to [facility] will help stones pass easier

## 2018-10-07 NOTE — Interval H&P Note (Signed)
History and Physical Interval Note:  10/07/2018 10:30 AM  Sabrina Carney  has presented today for surgery, with the diagnosis of LEFT UTEROPELVIC JUNCTION STONE.  The various methods of treatment have been discussed with the patient and family. After consideration of risks, benefits and other options for treatment, the patient has consented to  Procedure(s): EXTRACORPOREAL SHOCK WAVE LITHOTRIPSY (ESWL) (Left) as a surgical intervention.  The patient's history has been reviewed, patient examined, no change in status, stable for surgery.  I have reviewed the patient's chart and labs.  Questions were answered to the patient's satisfaction.     [name] Jerett Odonohue

## 2018-10-07 NOTE — Op Note (Signed)
See [facility] OP note scanned into chart. 

## 2018-10-08 ENCOUNTER — Encounter (HOSPITAL_COMMUNITY): Payer: Self-pay | Admitting: Urology

## 2018-10-17 ENCOUNTER — Telehealth: Payer: Self-pay | Admitting: Family Medicine

## 2018-10-17 NOTE — Telephone Encounter (Signed)
I called patient and LVM regarding rescheduling 9/4 appt due to [facility] being closed each Friday. Requested patient call back to reschedule.

## 2018-10-22 ENCOUNTER — Encounter: Payer: Self-pay | Admitting: Family Medicine

## 2018-11-01 ENCOUNTER — Ambulatory Visit: Payer: 59 | Admitting: Family Medicine

## 2018-12-10 ENCOUNTER — Ambulatory Visit: Payer: Self-pay

## 2018-12-10 ENCOUNTER — Other Ambulatory Visit: Payer: Self-pay | Admitting: Family Medicine

## 2018-12-10 ENCOUNTER — Other Ambulatory Visit: Payer: Self-pay

## 2018-12-10 DIAGNOSIS — M79632 Pain in left forearm: Secondary | ICD-10-CM

## 2019-01-17 ENCOUNTER — Other Ambulatory Visit: Payer: Self-pay

## 2019-01-17 DIAGNOSIS — J069 Acute upper respiratory infection, unspecified: Secondary | ICD-10-CM

## 2019-01-17 DIAGNOSIS — Z20822 Contact with and (suspected) exposure to covid-19: Secondary | ICD-10-CM

## 2019-01-18 MED ORDER — ALBUTEROL SULFATE HFA 108 (90 BASE) MCG/ACT IN AERS: INHALATION_SPRAY | RESPIRATORY_TRACT | 3 refills | Status: AC

## 2019-01-19 LAB — NOVEL CORONAVIRUS, NAA: SARS-CoV-2, NAA: NOT DETECTED

## 2019-01-21 ENCOUNTER — Telehealth: Payer: Self-pay | Admitting: Family Medicine

## 2019-01-21 NOTE — Telephone Encounter (Signed)
1) Medication(s) Requested (by name): topamax 2) Pharmacy of Choice: [facility] on [address]

## 2019-01-21 NOTE — Telephone Encounter (Signed)
LMVM for pharmacy that pt needs refill on topamax.  Has refill per last note 05-01-2018 for one yr.  Please call back if problem.

## 2019-01-22 NOTE — Telephone Encounter (Signed)
I called pharmacy [facility] and topamax does have refills available and one is ready for her now.  I called and LMVM for pt that medication is ready for pick up.  She is to call back if questions.
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
## 2019-02-26 ENCOUNTER — Ambulatory Visit (INDEPENDENT_AMBULATORY_CARE_PROVIDER_SITE_OTHER): Payer: 59 | Admitting: Family Medicine

## 2019-02-26 ENCOUNTER — Other Ambulatory Visit: Payer: Self-pay

## 2019-02-26 ENCOUNTER — Encounter: Payer: Self-pay | Admitting: Family Medicine

## 2019-02-26 VITALS — BP 111/73 | HR 76 | Temp 97.6°F | Ht 70.0 in | Wt 184.4 lb

## 2019-02-26 DIAGNOSIS — G43709 Chronic migraine without aura, not intractable, without status migrainosus: Secondary | ICD-10-CM

## 2019-02-26 DIAGNOSIS — N200 Calculus of kidney: Secondary | ICD-10-CM | POA: Diagnosis not present

## 2019-02-26 MED ORDER — TOPIRAMATE 25 MG PO TABS: 50.0000 mg | ORAL_TABLET | Freq: Every day | ORAL | 3 refills | Status: AC

## 2019-02-26 NOTE — Patient Instructions (Addendum)
We will continue topiramate for now but will decrease dose to 50mg  at bedtime. Consider switching to Ajovy if you have another kidney stone.   Make sure to drink plenty of water   Please make sure to follow up closely with urology closely  Follow up in 1 year   Fremanezumab injection What is this medicine? FREMANEZUMAB (fre ma NEZ ue mab) is used to prevent migraine headaches. This medicine may be used for other purposes; ask your health care provider or pharmacist if you have questions. COMMON BRAND NAME(S): AJOVY What should I tell my health care provider before I take this medicine? They need to know if you have any of these conditions:  an unusual or allergic reaction to fremanezumab, other medicines, foods, dyes, or preservatives  pregnant or trying to get pregnant  breast-feeding How should I use this medicine? This medicine is for injection under the skin. You will be taught how to prepare and give this medicine. Use exactly as directed. Take your medicine at regular intervals. Do not take your medicine more often than directed. It is important that you put your used needles and syringes in a special sharps container. Do not put them in a trash can. If you do not have a sharps container, call your pharmacist or healthcare provider to get one. Talk to your pediatrician regarding the use of this medicine in children. Special care may be needed. Overdosage: If you think you have taken too much of this medicine contact a poison control center or emergency room at once. NOTE: This medicine is only for you. Do not share this medicine with others. What if I miss a dose? If you miss a dose, take it as soon as you can. If it is almost time for your next dose, take only that dose. Do not take double or extra doses. What may interact with this medicine? Interactions are not expected. This list may not describe all possible interactions. Give your health care provider a list of all the  medicines, herbs, non-prescription drugs, or dietary supplements you use. Also tell them if you smoke, drink alcohol, or use illegal drugs. Some items may interact with your medicine. What should I watch for while using this medicine? Tell your doctor or healthcare professional if your symptoms do not start to get better or if they get worse. What side effects may I notice from receiving this medicine? Side effects that you should report to your doctor or health care professional as soon as possible:  allergic reactions like skin rash, itching or hives, swelling of the face, lips, or tongue Side effects that usually do not require medical attention (report these to your doctor or health care professional if they continue or are bothersome):  pain, redness, or irritation at site where injected This list may not describe all possible side effects. Call your doctor for medical advice about side effects. You may report side effects to FDA at 1-800-FDA-1088. Where should I keep my medicine? Keep out of the reach of children. You will be instructed on how to store this medicine. Throw away any unused medicine after the expiration date on the label. NOTE: This sheet is a summary. It may not cover all possible information. If you have questions about this medicine, talk to your doctor, pharmacist, or health care provider.  2020 Elsevier/Gold Standard (2016-11-13 17:22:56)   Topiramate tablets What is this medicine? TOPIRAMATE (toe PYRE a mate) is used to treat seizures in adults or children with epilepsy.  It is also used for the prevention of migraine headaches. This medicine may be used for other purposes; ask your health care provider or pharmacist if you have questions. COMMON BRAND NAME(S): Topamax, Topiragen What should I tell my health care provider before I take this medicine? They need to know if you have any of these conditions:  bleeding disorders  cirrhosis of the liver or liver  disease  diarrhea  glaucoma  kidney stones or kidney disease  low blood counts, like low white cell, platelet, or red cell counts  lung disease like asthma, obstructive pulmonary disease, emphysema  metabolic acidosis  on a ketogenic diet  schedule for surgery or a procedure  suicidal thoughts, plans, or attempt; a previous suicide attempt by you or a family member  an unusual or allergic reaction to topiramate, other medicines, foods, dyes, or preservatives  pregnant or trying to get pregnant  breast-feeding How should I use this medicine? Take this medicine by mouth with a glass of water. Follow the directions on the prescription label. Do not crush or chew. You may take this medicine with meals. Take your medicine at regular intervals. Do not take it more often than directed. Talk to your pediatrician regarding the use of this medicine in children. Special care may be needed. While this drug may be prescribed for children as young as 60 years of age for selected conditions, precautions do apply. Overdosage: If you think you have taken too much of this medicine contact a poison control center or emergency room at once. NOTE: This medicine is only for you. Do not share this medicine with others. What if I miss a dose? If you miss a dose, take it as soon as you can. If your next dose is to be taken in less than 6 hours, then do not take the missed dose. Take the next dose at your regular time. Do not take double or extra doses. What may interact with this medicine? Do not take this medicine with any of the following medications:  probenecid This medicine may also interact with the following medications:  acetazolamide  alcohol  amitriptyline  aspirin and aspirin-like medicines  birth control pills  certain medicines for depression  certain medicines for seizures  certain medicines that treat or prevent blood clots like warfarin, enoxaparin, dalteparin, apixaban,  dabigatran, and rivaroxaban  digoxin  hydrochlorothiazide  lithium  medicines for pain, sleep, or muscle relaxation  metformin  methazolamide  NSAIDS, medicines for pain and inflammation, like ibuprofen or naproxen  pioglitazone  risperidone This list may not describe all possible interactions. Give your health care provider a list of all the medicines, herbs, non-prescription drugs, or dietary supplements you use. Also tell them if you smoke, drink alcohol, or use illegal drugs. Some items may interact with your medicine. What should I watch for while using this medicine? Visit your doctor or health care professional for regular checks on your progress. Do not stop taking this medicine suddenly. This increases the risk of seizures if you are using this medicine to control epilepsy. Wear a medical identification bracelet or chain to say you have epilepsy or seizures, and carry a card that lists all your medicines. This medicine can decrease sweating and increase your body temperature. Watch for signs of deceased sweating or fever, especially in children. Avoid extreme heat, hot baths, and saunas. Be careful about exercising, especially in hot weather. Contact your health care provider right away if you notice a fever or decrease in  sweating. You should drink plenty of fluids while taking this medicine. If you have had kidney stones in the past, this will help to reduce your chances of forming kidney stones. If you have stomach pain, with nausea or vomiting and yellowing of your eyes or skin, call your doctor immediately. You may get drowsy, dizzy, or have blurred vision. Do not drive, use machinery, or do anything that needs mental alertness until you know how this medicine affects you. To reduce dizziness, do not sit or stand up quickly, especially if you are an older patient. Alcohol can increase drowsiness and dizziness. Avoid alcoholic drinks. If you notice blurred vision, eye pain, or  other eye problems, seek medical attention at once for an eye exam. The use of this medicine may increase the chance of suicidal thoughts or actions. Pay special attention to how you are responding while on this medicine. Any worsening of mood, or thoughts of suicide or dying should be reported to your health care professional right away. This medicine may increase the chance of developing metabolic acidosis. If left untreated, this can cause kidney stones, bone disease, or slowed growth in children. Symptoms include breathing fast, fatigue, loss of appetite, irregular heartbeat, or loss of consciousness. Call your doctor immediately if you experience any of these side effects. Also, tell your doctor about any surgery you plan on having while taking this medicine since this may increase your risk for metabolic acidosis. Birth control pills may not work properly while you are taking this medicine. Talk to your doctor about using an extra method of birth control. Women who become pregnant while using this medicine may enroll in the [place] Antiepileptic Drug Pregnancy Registry by calling [phone]. This registry collects information about the safety of antiepileptic drug use during pregnancy. What side effects may I notice from receiving this medicine? Side effects that you should report to your doctor or health care professional as soon as possible:  allergic reactions like skin rash, itching or hives, swelling of the face, lips, or tongue  decreased sweating and/or rise in body temperature  depression  difficulty breathing, fast or irregular breathing patterns  difficulty speaking  difficulty walking or controlling muscle movements  hearing impairment  redness, blistering, peeling or loosening of the skin, including inside the mouth  tingling, pain or numbness in the hands or feet  unusual bleeding or bruising  unusually weak or tired  worsening of mood, thoughts or actions  of suicide or dying Side effects that usually do not require medical attention (report to your doctor or health care professional if they continue or are bothersome):  altered taste  back pain, joint or muscle aches and pains  diarrhea, or constipation  headache  loss of appetite  nausea  stomach upset, indigestion  tremors This list may not describe all possible side effects. Call your doctor for medical advice about side effects. You may report side effects to FDA at 1-800-FDA-1088. Where should I keep my medicine? Keep out of the reach of children. Store at room temperature between 15 and 30 degrees C (59 and 86 degrees F) in a tightly closed container. Protect from moisture. Throw away any unused medicine after the expiration date. NOTE: This sheet is a summary. It may not cover all possible information. If you have questions about this medicine, talk to your doctor, pharmacist, or health care provider.  2020 Elsevier/Gold Standard (2013-02-17 23:17:57)
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
## 2020-02-26 ENCOUNTER — Encounter: Payer: Self-pay | Admitting: Family Medicine

## 2020-02-26 ENCOUNTER — Ambulatory Visit: Payer: 59 | Admitting: Family Medicine

## 2020-07-24 IMAGING — DX ABDOMEN - 1 VIEW
2 series · 2 of 2 positions shown · non-contrast
Comparison: Abdominal radiographs 09/30/2018, CT renal stone
protocol 09/27/2018

CLINICAL DATA: Pre lithotripsy

EXAM:
ABDOMEN - 1 VIEW

[abdomen kub (1 of 2)]
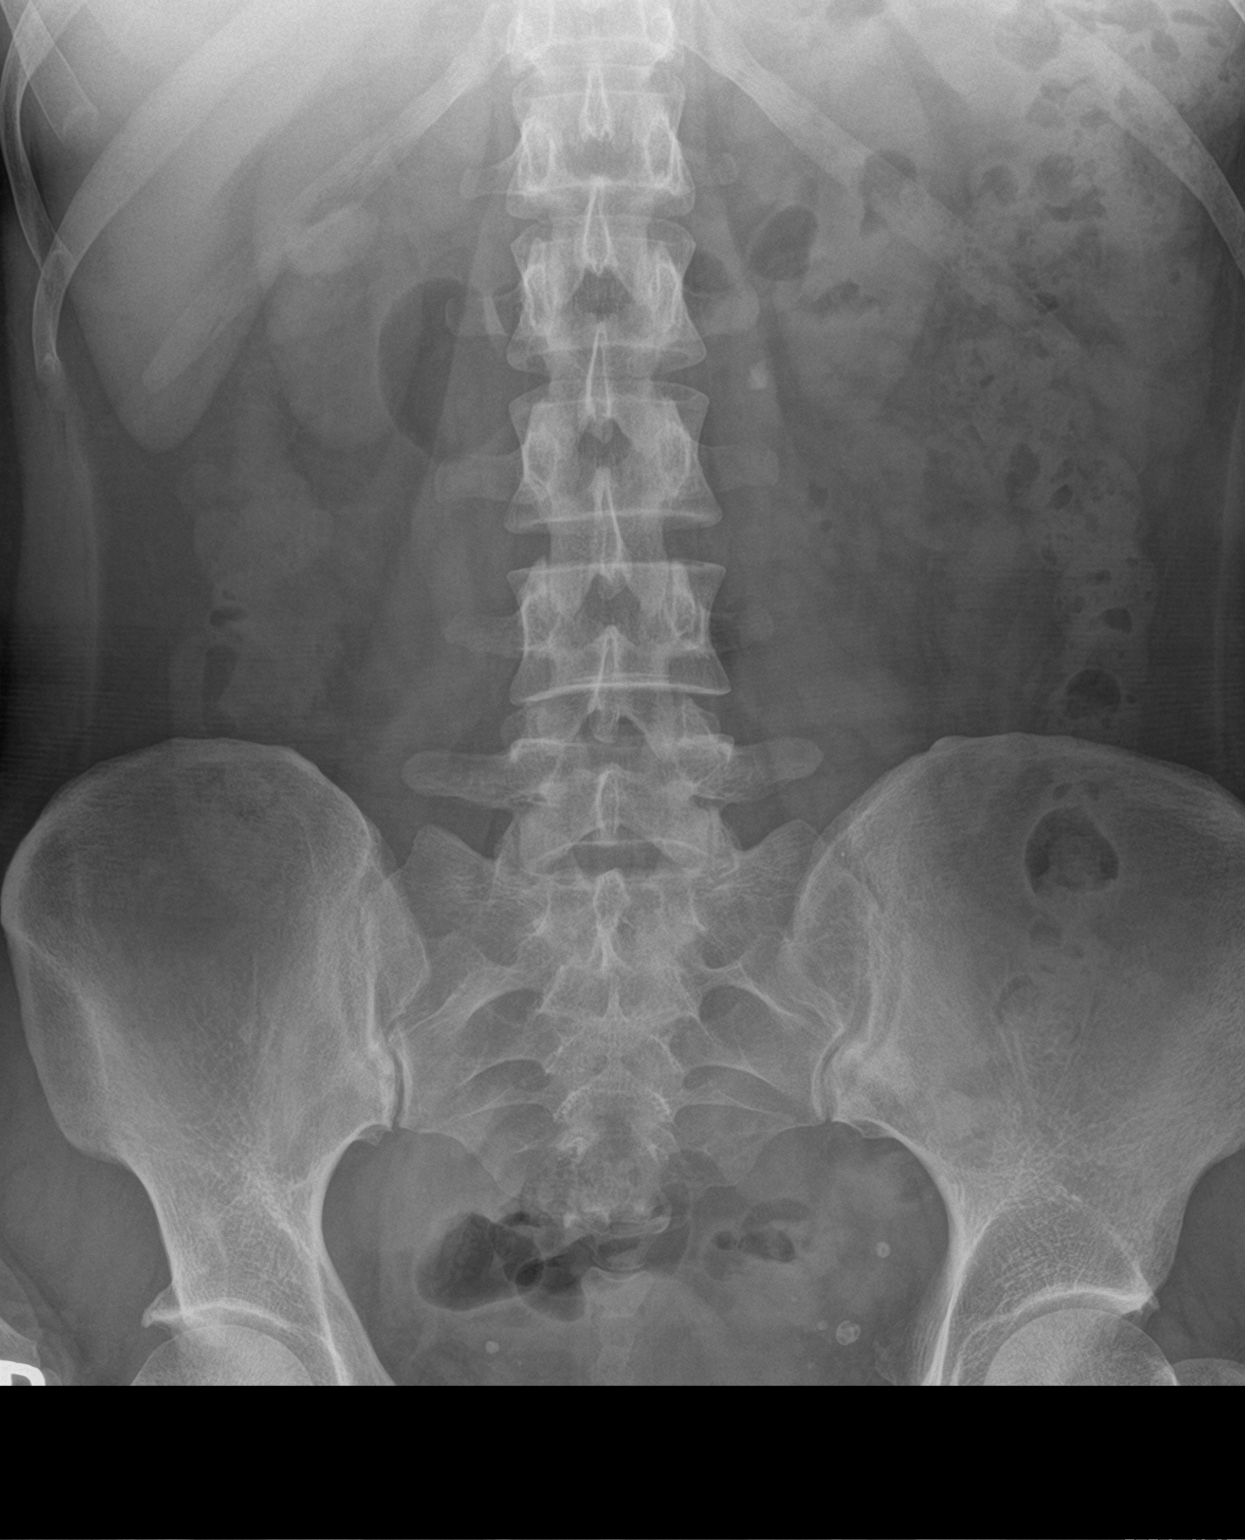

[abdomen kub (2 of 2)]
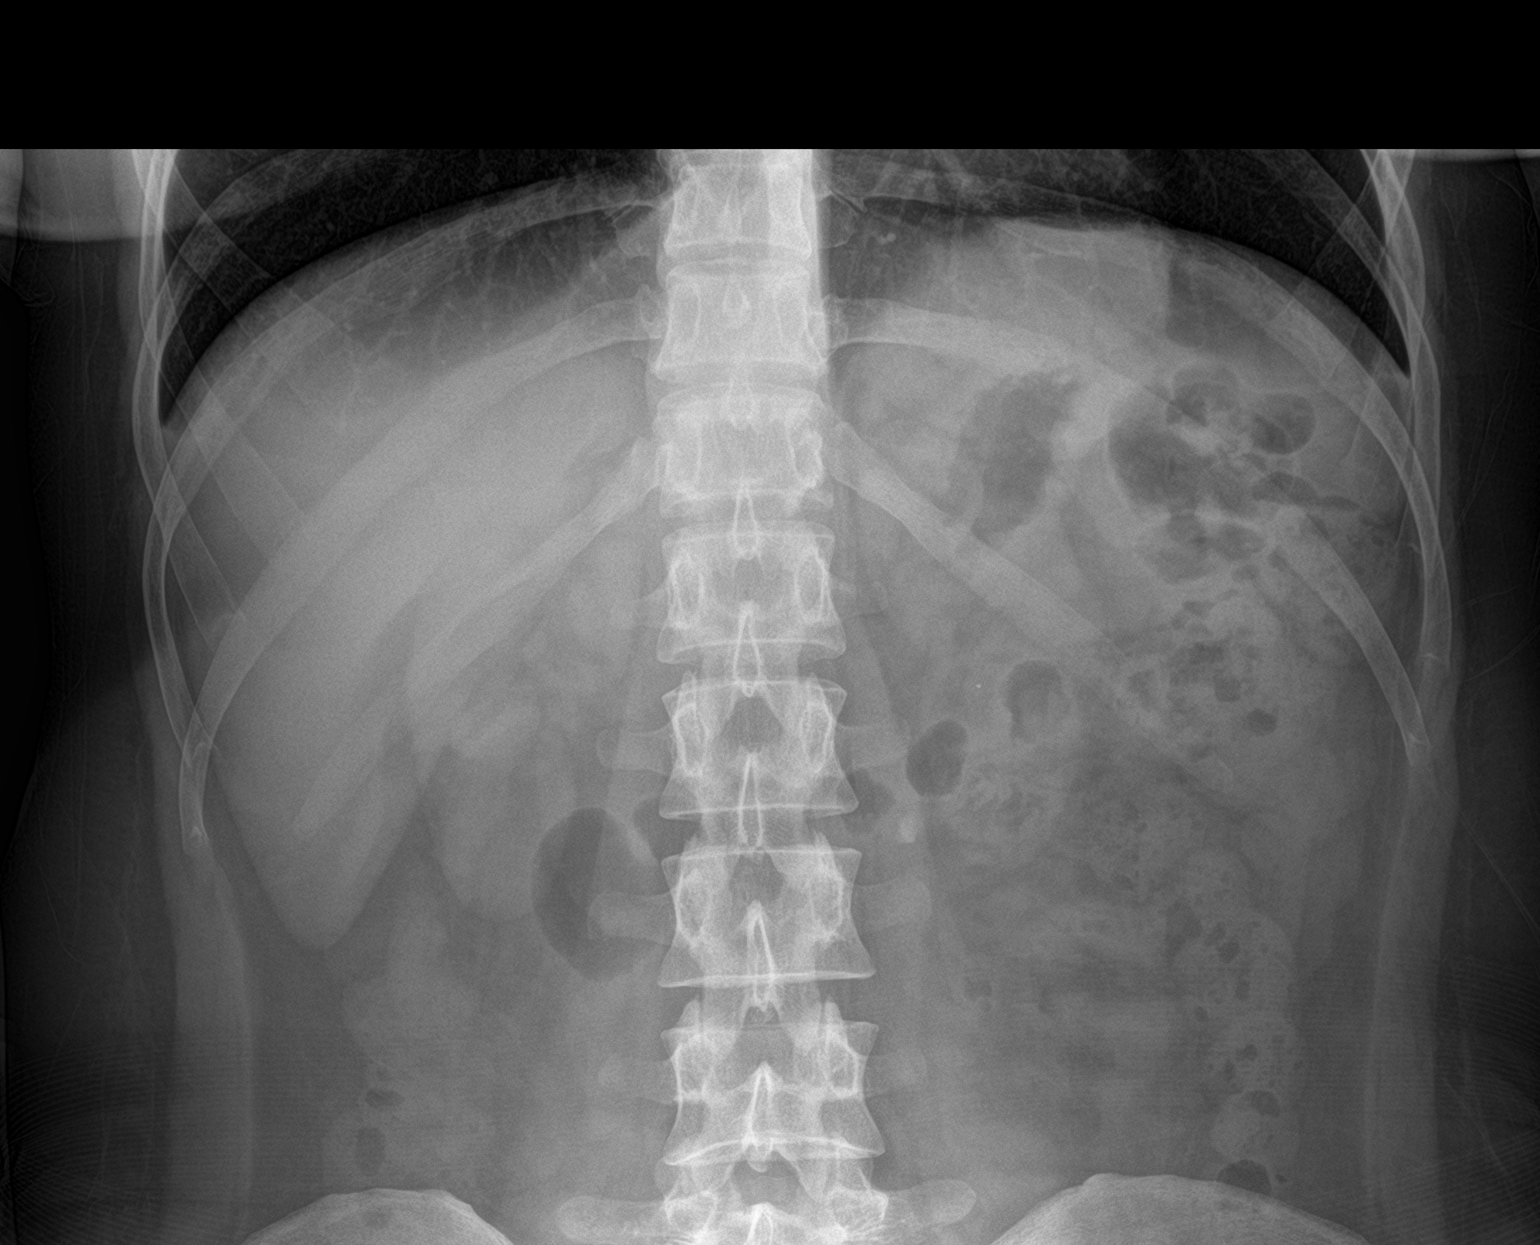

[2 of 2 positions shown; findings below may reference images not displayed]

FINDINGS: Similar to prior abdominal radiographs 09/30/2018, there is a small
hyperdensity which projects left of midline at the L2-L3 level,
likely correspond with the 6 x 5 mm left ureteropelvic junction
calculus demonstrated on prior CT abdomen/pelvis 09/27/2018.
Multiple small calcific densities within the bilateral pelvis are
nonspecific but may reflect phleboliths. Nonobstructive bowel gas
pattern. The imaged lung bases are clear. No acute bony abnormality.
IMPRESSION: Persistent small hyperdensity projecting left of midline at the
L2-L3 level, likely corresponding with the 6 mm left UPJ calculus
demonstrated on CT abdomen/pelvis 09/27/2018.

## 2022-09-12 ENCOUNTER — Emergency Department (HOSPITAL_COMMUNITY)
Admission: EM | Admit: 2022-09-12 | Discharge: 2022-09-12 | Disposition: A | Discharge status: Home / Self Care | Payer: 59 | Attending: Emergency Medicine | Admitting: Emergency Medicine

## 2022-09-12 ENCOUNTER — Other Ambulatory Visit: Payer: Self-pay

## 2022-09-12 ENCOUNTER — Encounter (HOSPITAL_COMMUNITY): Payer: Self-pay

## 2022-09-12 DIAGNOSIS — Y990 Civilian activity done for income or pay: Secondary | ICD-10-CM | POA: Insufficient documentation

## 2022-09-12 DIAGNOSIS — Z23 Encounter for immunization: Secondary | ICD-10-CM | POA: Diagnosis not present

## 2022-09-12 DIAGNOSIS — W298XXA Contact with other powered powered hand tools and household machinery, initial encounter: Secondary | ICD-10-CM | POA: Diagnosis not present

## 2022-09-12 DIAGNOSIS — S0181XA Laceration without foreign body of other part of head, initial encounter: Secondary | ICD-10-CM | POA: Insufficient documentation

## 2022-09-12 DIAGNOSIS — S0990XA Unspecified injury of head, initial encounter: Secondary | ICD-10-CM | POA: Insufficient documentation

## 2022-09-12 LAB — RAPID URINE DRUG SCREEN, HOSP PERFORMED
Amphetamines: NOT DETECTED
Barbiturates: NOT DETECTED
Benzodiazepines: NOT DETECTED
Cocaine: NOT DETECTED
Opiates: NOT DETECTED
Tetrahydrocannabinol: NOT DETECTED

## 2022-09-12 MED ORDER — ACETAMINOPHEN 325 MG PO TABS
650.0000 mg | ORAL_TABLET | Freq: Once | ORAL | Status: AC
  Administered 2022-09-12: 650 mg via ORAL
  Filled 2022-09-12: qty 2

## 2022-09-12 MED ORDER — LIDOCAINE HCL (PF) 1 % IJ SOLN
5.0000 mL | Freq: Once | INTRAMUSCULAR | Status: AC
  Administered 2022-09-12: 5 mL
  Filled 2022-09-12: qty 5

## 2022-09-12 MED ORDER — ONDANSETRON 4 MG PO TBDP
4.0000 mg | ORAL_TABLET | Freq: Once | ORAL | Status: AC
  Administered 2022-09-12: 4 mg via ORAL
  Filled 2022-09-12: qty 1

## 2022-09-12 MED ORDER — TETANUS-DIPHTH-ACELL PERTUSSIS 5-2.5-18.5 LF-MCG/0.5 IM SUSY
0.5000 mL | PREFILLED_SYRINGE | Freq: Once | INTRAMUSCULAR | Status: AC
  Administered 2022-09-12: 0.5 mL via INTRAMUSCULAR
  Filled 2022-09-12: qty 0.5

## 2022-09-12 NOTE — Discharge Instructions (Signed)
You were seen in the emergency department for your head injury.  You did have a cut in your forehead that required 2 stitches.  These should be removed by your primary doctor, in urgent care in the ER in 3 to 5 days.  You can shower normally, just do not soak your face underwater or swim while the stitches are in place and make sure that you completely dry your face when you are done.  You should return to the emergency department sooner if you notice pus draining from your wound, spreading redness from the wound, you are having fevers or any other new or concerning symptoms.

## 2022-09-12 NOTE — ED Triage Notes (Signed)
Pt at work building chair; using power drill, popped back and hit pt in forehead; no loc, no thinners; 1 inch lac to forehead; bandage in place, bleeding controlled ; c/o pain at site, burning, throbbing; pt alert, orient, NAD in triage
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
